# Patient Record
Sex: Female | Born: 1980 | Race: White | Hispanic: No | Marital: Married | Smoking: Never smoker
Health system: Southern US, Community
[De-identification: ages and names within clinical notes are randomized; demographics above are authoritative.]

## PROBLEM LIST (undated history)

## (undated) ENCOUNTER — Inpatient Hospital Stay (HOSPITAL_COMMUNITY): Payer: Self-pay

## (undated) DIAGNOSIS — G35D Multiple sclerosis, unspecified: Secondary | ICD-10-CM

## (undated) DIAGNOSIS — G43909 Migraine, unspecified, not intractable, without status migrainosus: Secondary | ICD-10-CM

## (undated) DIAGNOSIS — G35 Multiple sclerosis: Secondary | ICD-10-CM

## (undated) HISTORY — PX: KNEE SURGERY: SHX244

## (undated) HISTORY — PX: LAPAROSCOPY: SHX197

---

## 2000-10-25 ENCOUNTER — Emergency Department (HOSPITAL_COMMUNITY): Admission: EM | Admit: 2000-10-25 | Discharge: 2000-10-26 | Payer: Self-pay | Admitting: Emergency Medicine

## 2001-12-02 ENCOUNTER — Encounter: Admission: RE | Admit: 2001-12-02 | Discharge: 2001-12-02 | Payer: Self-pay | Admitting: Family Medicine

## 2002-01-13 ENCOUNTER — Encounter: Admission: RE | Admit: 2002-01-13 | Discharge: 2002-01-13 | Payer: Self-pay | Admitting: Family Medicine

## 2002-09-21 ENCOUNTER — Encounter: Admission: RE | Admit: 2002-09-21 | Discharge: 2002-09-21 | Payer: Self-pay | Admitting: Family Medicine

## 2002-10-26 ENCOUNTER — Encounter: Admission: RE | Admit: 2002-10-26 | Discharge: 2002-10-26 | Payer: Self-pay | Admitting: Sports Medicine

## 2004-09-19 ENCOUNTER — Emergency Department: Payer: Self-pay | Admitting: Emergency Medicine

## 2004-11-09 ENCOUNTER — Ambulatory Visit: Payer: Self-pay | Admitting: General Practice

## 2004-12-19 ENCOUNTER — Ambulatory Visit: Payer: Self-pay | Admitting: General Practice

## 2005-04-01 ENCOUNTER — Ambulatory Visit: Payer: Self-pay | Admitting: General Practice

## 2008-05-09 ENCOUNTER — Ambulatory Visit (HOSPITAL_COMMUNITY): Admission: RE | Admit: 2008-05-09 | Discharge: 2008-05-09 | Payer: Self-pay | Admitting: Obstetrics and Gynecology

## 2011-06-21 ENCOUNTER — Other Ambulatory Visit (HOSPITAL_COMMUNITY): Payer: Self-pay | Admitting: Gynecology

## 2011-06-21 DIAGNOSIS — N979 Female infertility, unspecified: Secondary | ICD-10-CM

## 2011-06-25 ENCOUNTER — Ambulatory Visit (HOSPITAL_COMMUNITY)
Admission: RE | Admit: 2011-06-25 | Discharge: 2011-06-25 | Disposition: A | Discharge status: Home / Self Care | Payer: BC Managed Care – PPO | Source: Ambulatory Visit | Attending: Gynecology | Admitting: Gynecology

## 2011-06-25 DIAGNOSIS — N979 Female infertility, unspecified: Secondary | ICD-10-CM | POA: Insufficient documentation

## 2011-06-25 MED ORDER — IOHEXOL 300 MG/ML  SOLN: 8.0000 mL | Freq: Once | INTRAMUSCULAR | Status: AC | PRN

## 2011-10-11 LAB — OB RESULTS CONSOLE GC/CHLAMYDIA
Chlamydia: NEGATIVE
Gonorrhea: NEGATIVE

## 2011-10-11 LAB — OB RESULTS CONSOLE RUBELLA ANTIBODY, IGM: Rubella: IMMUNE

## 2011-10-11 LAB — OB RESULTS CONSOLE RPR: RPR: NONREACTIVE

## 2011-10-11 LAB — OB RESULTS CONSOLE HIV ANTIBODY (ROUTINE TESTING): HIV: NONREACTIVE

## 2011-10-11 LAB — OB RESULTS CONSOLE ABO/RH

## 2011-10-11 LAB — OB RESULTS CONSOLE GBS: GBS: NEGATIVE

## 2011-11-17 ENCOUNTER — Inpatient Hospital Stay (HOSPITAL_COMMUNITY)
Admission: AD | Admit: 2011-11-17 | Discharge: 2011-11-17 | Disposition: A | Discharge status: Home / Self Care | Payer: BC Managed Care – PPO | Source: Ambulatory Visit | Attending: Obstetrics and Gynecology | Admitting: Obstetrics and Gynecology

## 2011-11-17 ENCOUNTER — Encounter (HOSPITAL_COMMUNITY): Payer: Self-pay | Admitting: *Deleted

## 2011-11-17 ENCOUNTER — Inpatient Hospital Stay (HOSPITAL_COMMUNITY): Payer: BC Managed Care – PPO

## 2011-11-17 DIAGNOSIS — O34599 Maternal care for other abnormalities of gravid uterus, unspecified trimester: Secondary | ICD-10-CM

## 2011-11-17 DIAGNOSIS — N83209 Unspecified ovarian cyst, unspecified side: Secondary | ICD-10-CM | POA: Insufficient documentation

## 2011-11-17 DIAGNOSIS — R109 Unspecified abdominal pain: Secondary | ICD-10-CM | POA: Insufficient documentation

## 2011-11-17 LAB — URINALYSIS, ROUTINE W REFLEX MICROSCOPIC
Bilirubin Urine: NEGATIVE
Protein, ur: NEGATIVE mg/dL
Urobilinogen, UA: 0.2 mg/dL (ref 0.0–1.0)
pH: 6 (ref 5.0–8.0)

## 2011-11-17 LAB — WET PREP, GENITAL: Trich, Wet Prep: NONE SEEN

## 2011-11-17 MED ORDER — IBUPROFEN 800 MG PO TABS: 800.0000 mg | ORAL_TABLET | Freq: Three times a day (TID) | ORAL | Status: AC | PRN

## 2011-11-17 NOTE — MAU Provider Note (Signed)
History     CSN: [id number]  Arrival date and time: 11/17/11 1238   None     Chief Complaint  Patient presents with  . Abdominal Pain   HPI 31 y.o. G1P0000 at [redacted]w[redacted]d c/o left lower abd pain, started this morning, worse with walking and laying supine, better when sitting up. No bleeding or discharge. H/O left ovarian cyst this pregnancy.   History reviewed. No pertinent past medical history.  Past Surgical History  Procedure Date  . Knee surgery   . Laparoscopy     History reviewed. No pertinent family history.  History  Substance Use Topics  . Smoking status: Never Smoker   . Smokeless tobacco: Not on file  . Alcohol Use: No    Allergies: Allergies not on file  Prescriptions prior to admission  Medication Sig Dispense Refill  . acetaminophen (TYLENOL) 325 MG tablet Take 650 mg by mouth every 6 (six) hours as needed. For headaches.      . ondansetron (ZOFRAN) 8 MG tablet Take 8 mg by mouth every 8 (eight) hours as needed. For nausea and vomiting.      . Prenatal Vit-Fe Fumarate-FA (PRENATAL MULTIVITAMIN) TABS Take 1 tablet by mouth daily.        Review of Systems  Constitutional: Negative.   Respiratory: Negative.   Cardiovascular: Negative.   Gastrointestinal: Positive for abdominal pain. Negative for nausea, vomiting, diarrhea and constipation.  Genitourinary: Negative for dysuria, urgency, frequency, hematuria and flank pain.       Negative for vaginal bleeding, vaginal discharge  Musculoskeletal: Negative.   Neurological: Negative.   Psychiatric/Behavioral: Negative.    Physical Exam   Blood pressure 126/73, pulse 86, temperature 99.3 F (37.4 C), temperature source Oral, resp. rate 18, height 5\' 2"  (1.575 m), weight 152 lb 6.4 oz (69.128 kg), last menstrual period 08/09/2011.  Physical Exam  Constitutional: She is oriented to person, place, and time. She appears well-developed and well-nourished. No distress.  HENT:  Head: Normocephalic and atraumatic.   Cardiovascular: Normal rate.   Respiratory: Effort normal. No respiratory distress.  GI: Soft. She exhibits no distension and no mass. There is no tenderness. There is no rebound and no guarding.  Genitourinary: There is no rash or lesion on the right labia. There is no rash or lesion on the left labia. Uterus is not deviated, not enlarged, not fixed and not tender. Cervix exhibits friability. Cervix exhibits no motion tenderness and no discharge. Right adnexum displays no mass, no tenderness and no fullness. Left adnexum displays no mass, no tenderness and no fullness. No erythema, tenderness or bleeding around the vagina. Vaginal discharge (small, white, nonmalodorous) found.    Neurological: She is alert and oriented to person, place, and time.  Skin: Skin is warm and dry.  Psychiatric: She has a normal mood and affect.    MAU Course  Procedures Results for orders placed during the hospital encounter of 11/17/11 (from the past 24 hour(s))  URINALYSIS, ROUTINE W REFLEX MICROSCOPIC     Status: Abnormal   Collection Time   11/17/11 12:45 PM      Component Value Range   Color, Urine YELLOW  YELLOW    APPearance CLEAR  CLEAR    Specific Gravity, Urine 1.025  1.005 - 1.030    pH 6.0  5.0 - 8.0    Glucose, UA NEGATIVE  NEGATIVE (mg/dL)   Hgb urine dipstick TRACE (*) NEGATIVE    Bilirubin Urine NEGATIVE  NEGATIVE    Ketones, ur  NEGATIVE  NEGATIVE (mg/dL)   Protein, ur NEGATIVE  NEGATIVE (mg/dL)   Urobilinogen, UA 0.2  0.0 - 1.0 (mg/dL)   Nitrite NEGATIVE  NEGATIVE    Leukocytes, UA NEGATIVE  NEGATIVE   URINE MICROSCOPIC-ADD ON     Status: Normal   Collection Time   11/17/11 12:45 PM      Component Value Range   Squamous Epithelial / LPF RARE  RARE    RBC / HPF 0-2  <3 (RBC/hpf)   Bacteria, UA RARE  RARE   WET PREP, GENITAL     Status: Abnormal   Collection Time   11/17/11  1:15 PM      Component Value Range   Yeast Wet Prep HPF POC NONE SEEN  NONE SEEN    Trich, Wet Prep NONE SEEN   NONE SEEN    Clue Cells Wet Prep HPF POC NONE SEEN  NONE SEEN    WBC, Wet Prep HPF POC FEW (*) NONE SEEN    U/S: cervix 4.8 cm long and closed, 3 cm left ovarian cyst, otherwise normal  Assessment and Plan  31 y.o. G1P0000 at [redacted]w[redacted]d Left ovarian cyst Motrin 800 TID prn F/U as scheduled  Greydon Betke 11/17/2011, 1:33 PM

## 2011-11-17 NOTE — MAU Note (Signed)
Pt reprots having a dull pain on her left side/abd that gets sharp when she walks or moves around. Pain started earlier today,

## 2011-11-18 LAB — GC/CHLAMYDIA PROBE AMP, GENITAL
Chlamydia, DNA Probe: NEGATIVE
GC Probe Amp, Genital: NEGATIVE

## 2012-04-19 ENCOUNTER — Inpatient Hospital Stay (HOSPITAL_COMMUNITY): Admission: AD | Admit: 2012-04-19 | Payer: Self-pay | Source: Ambulatory Visit | Admitting: Obstetrics and Gynecology

## 2012-04-28 ENCOUNTER — Telehealth (HOSPITAL_COMMUNITY): Payer: Self-pay | Admitting: *Deleted

## 2012-04-28 NOTE — Telephone Encounter (Signed)
Preadmission screen  

## 2012-05-12 ENCOUNTER — Inpatient Hospital Stay (HOSPITAL_COMMUNITY)
Admission: AD | Admit: 2012-05-12 | Discharge: 2012-05-15 | DRG: 373 | Disposition: A | Discharge status: Home / Self Care | Payer: BC Managed Care – PPO | Source: Ambulatory Visit | Attending: Obstetrics and Gynecology | Admitting: Obstetrics and Gynecology

## 2012-05-12 ENCOUNTER — Encounter (HOSPITAL_COMMUNITY): Payer: Self-pay | Admitting: Anesthesiology

## 2012-05-12 ENCOUNTER — Inpatient Hospital Stay (HOSPITAL_COMMUNITY): Payer: BC Managed Care – PPO | Admitting: Anesthesiology

## 2012-05-12 ENCOUNTER — Encounter (HOSPITAL_COMMUNITY): Payer: Self-pay

## 2012-05-12 LAB — CBC
HCT: 33.6 % — ABNORMAL LOW (ref 36.0–46.0)
Hemoglobin: 11.6 g/dL — ABNORMAL LOW (ref 12.0–15.0)
MCH: 29.1 pg (ref 26.0–34.0)
MCV: 84.4 fL (ref 78.0–100.0)
Platelets: 222 10*3/uL (ref 150–400)
RBC: 3.98 MIL/uL (ref 3.87–5.11)

## 2012-05-12 MED ORDER — OXYTOCIN 40 UNITS IN LACTATED RINGERS INFUSION - SIMPLE MED: 62.5000 mL/h | Freq: Once | INTRAVENOUS | Status: DC

## 2012-05-12 MED ORDER — ACETAMINOPHEN 325 MG PO TABS: 650.0000 mg | ORAL_TABLET | ORAL | Status: DC | PRN

## 2012-05-12 MED ORDER — ONDANSETRON HCL 4 MG/2ML IJ SOLN: 4.0000 mg | Freq: Four times a day (QID) | INTRAMUSCULAR | Status: DC | PRN

## 2012-05-12 MED ORDER — PHENYLEPHRINE 40 MCG/ML (10ML) SYRINGE FOR IV PUSH (FOR BLOOD PRESSURE SUPPORT)
80.0000 ug | PREFILLED_SYRINGE | INTRAVENOUS | Status: DC | PRN
  Filled 2012-05-12: qty 5

## 2012-05-12 MED ORDER — EPHEDRINE 5 MG/ML INJ
10.0000 mg | INTRAVENOUS | Status: DC | PRN
  Filled 2012-05-12: qty 4

## 2012-05-12 MED ORDER — CITRIC ACID-SODIUM CITRATE 334-500 MG/5ML PO SOLN: 30.0000 mL | ORAL | Status: DC | PRN

## 2012-05-12 MED ORDER — LIDOCAINE HCL (PF) 1 % IJ SOLN
INTRAMUSCULAR | Status: DC | PRN
  Administered 2012-05-12: 4 mL
  Administered 2012-05-12: 2 mL
  Administered 2012-05-12 (×2): 4 mL

## 2012-05-12 MED ORDER — LACTATED RINGERS IV SOLN: 500.0000 mL | Freq: Once | INTRAVENOUS | Status: DC

## 2012-05-12 MED ORDER — LACTATED RINGERS IV SOLN: 500.0000 mL | INTRAVENOUS | Status: DC | PRN

## 2012-05-12 MED ORDER — FENTANYL 2.5 MCG/ML BUPIVACAINE 1/10 % EPIDURAL INFUSION (WH - ANES)
14.0000 mL/h | INTRAMUSCULAR | Status: DC
  Administered 2012-05-12 – 2012-05-13 (×2): 14 mL/h via EPIDURAL
  Filled 2012-05-12 (×2): qty 125

## 2012-05-12 MED ORDER — PHENYLEPHRINE 40 MCG/ML (10ML) SYRINGE FOR IV PUSH (FOR BLOOD PRESSURE SUPPORT): 80.0000 ug | PREFILLED_SYRINGE | INTRAVENOUS | Status: DC | PRN

## 2012-05-12 MED ORDER — EPHEDRINE 5 MG/ML INJ: 10.0000 mg | INTRAVENOUS | Status: DC | PRN

## 2012-05-12 MED ORDER — LACTATED RINGERS IV SOLN
INTRAVENOUS | Status: DC
  Administered 2012-05-13: 125 mL/h via INTRAVENOUS

## 2012-05-12 MED ORDER — IBUPROFEN 600 MG PO TABS: 600.0000 mg | ORAL_TABLET | Freq: Four times a day (QID) | ORAL | Status: DC | PRN

## 2012-05-12 MED ORDER — OXYCODONE-ACETAMINOPHEN 5-325 MG PO TABS: 1.0000 | ORAL_TABLET | ORAL | Status: DC | PRN

## 2012-05-12 MED ORDER — LIDOCAINE HCL (PF) 1 % IJ SOLN
30.0000 mL | INTRAMUSCULAR | Status: DC | PRN
  Filled 2012-05-12: qty 30

## 2012-05-12 MED ORDER — DIPHENHYDRAMINE HCL 50 MG/ML IJ SOLN: 12.5000 mg | INTRAMUSCULAR | Status: DC | PRN

## 2012-05-12 MED ORDER — OXYTOCIN BOLUS FROM INFUSION
500.0000 mL | Freq: Once | INTRAVENOUS | Status: AC
  Administered 2012-05-13: 500 mL via INTRAVENOUS
  Filled 2012-05-12: qty 500

## 2012-05-12 NOTE — Anesthesia Preprocedure Evaluation (Signed)

## 2012-05-12 NOTE — Anesthesia Procedure Notes (Signed)
Epidural Patient location during procedure: OB Start time: 05/12/2012 11:25 PM  Staffing Performed by: anesthesiologist   Preanesthetic Checklist Completed: patient identified, site marked, surgical consent, pre-op evaluation, timeout performed, IV checked, risks and benefits discussed and monitors and equipment checked  Epidural Patient position: sitting Prep: site prepped and draped and DuraPrep Patient monitoring: continuous pulse ox and blood pressure Approach: midline Injection technique: LOR air  Needle:  Needle type: Tuohy  Needle gauge: 17 G Needle length: 9 cm and 9 Needle insertion depth: 5 cm cm Catheter type: closed end flexible Catheter size: 19 Gauge Catheter at skin depth: 10 cm Test dose: negative  Assessment Events: blood not aspirated, injection not painful, no injection resistance, negative IV test and no paresthesia  Additional Notes Discussed risk of headache, infection, bleeding, nerve injury and failed or incomplete block.  Patient voices understanding and wishes to proceed. Reason for block:procedure for pain

## 2012-05-12 NOTE — MAU Note (Signed)
Pt states she thinks water broke around 2015. She states she is having irregular UCs

## 2012-05-13 ENCOUNTER — Encounter (HOSPITAL_COMMUNITY): Payer: Self-pay | Admitting: *Deleted

## 2012-05-13 MED ORDER — ONDANSETRON HCL 4 MG/2ML IJ SOLN: 4.0000 mg | INTRAMUSCULAR | Status: DC | PRN

## 2012-05-13 MED ORDER — ONDANSETRON HCL 4 MG PO TABS: 4.0000 mg | ORAL_TABLET | ORAL | Status: DC | PRN

## 2012-05-13 MED ORDER — LANOLIN HYDROUS EX OINT: TOPICAL_OINTMENT | CUTANEOUS | Status: DC | PRN

## 2012-05-13 MED ORDER — TETANUS-DIPHTH-ACELL PERTUSSIS 5-2.5-18.5 LF-MCG/0.5 IM SUSP: 0.5000 mL | Freq: Once | INTRAMUSCULAR | Status: DC

## 2012-05-13 MED ORDER — WITCH HAZEL-GLYCERIN EX PADS
1.0000 "application " | MEDICATED_PAD | CUTANEOUS | Status: DC | PRN
  Administered 2012-05-14: 1 via TOPICAL

## 2012-05-13 MED ORDER — SIMETHICONE 80 MG PO CHEW: 80.0000 mg | CHEWABLE_TABLET | ORAL | Status: DC | PRN

## 2012-05-13 MED ORDER — MEDROXYPROGESTERONE ACETATE 150 MG/ML IM SUSP: 150.0000 mg | INTRAMUSCULAR | Status: DC | PRN

## 2012-05-13 MED ORDER — DIBUCAINE 1 % RE OINT: 1.0000 "application " | TOPICAL_OINTMENT | RECTAL | Status: DC | PRN

## 2012-05-13 MED ORDER — OXYCODONE-ACETAMINOPHEN 5-325 MG PO TABS: 1.0000 | ORAL_TABLET | ORAL | Status: DC | PRN

## 2012-05-13 MED ORDER — MEASLES, MUMPS & RUBELLA VAC ~~LOC~~ INJ: 0.5000 mL | INJECTION | Freq: Once | SUBCUTANEOUS | Status: DC

## 2012-05-13 MED ORDER — MEASLES, MUMPS & RUBELLA VAC ~~LOC~~ INJ
0.5000 mL | INJECTION | Freq: Once | SUBCUTANEOUS | Status: DC
  Filled 2012-05-13: qty 0.5

## 2012-05-13 MED ORDER — BENZOCAINE-MENTHOL 20-0.5 % EX AERO
1.0000 "application " | INHALATION_SPRAY | CUTANEOUS | Status: DC | PRN
  Filled 2012-05-13: qty 56

## 2012-05-13 MED ORDER — IBUPROFEN 600 MG PO TABS
600.0000 mg | ORAL_TABLET | Freq: Four times a day (QID) | ORAL | Status: DC
  Administered 2012-05-13 – 2012-05-15 (×8): 600 mg via ORAL
  Filled 2012-05-13 (×8): qty 1

## 2012-05-13 MED ORDER — IBUPROFEN 600 MG PO TABS: 600.0000 mg | ORAL_TABLET | Freq: Four times a day (QID) | ORAL | Status: DC

## 2012-05-13 MED ORDER — PRENATAL MULTIVITAMIN CH: 1.0000 | ORAL_TABLET | Freq: Every day | ORAL | Status: DC

## 2012-05-13 MED ORDER — BENZOCAINE-MENTHOL 20-0.5 % EX AERO: 1.0000 "application " | INHALATION_SPRAY | CUTANEOUS | Status: DC | PRN

## 2012-05-13 MED ORDER — PRENATAL MULTIVITAMIN CH
1.0000 | ORAL_TABLET | Freq: Every day | ORAL | Status: DC
  Administered 2012-05-14 – 2012-05-15 (×2): 1 via ORAL
  Filled 2012-05-13 (×2): qty 1

## 2012-05-13 MED ORDER — TERBUTALINE SULFATE 1 MG/ML IJ SOLN: 0.2500 mg | Freq: Once | INTRAMUSCULAR | Status: DC | PRN

## 2012-05-13 MED ORDER — WITCH HAZEL-GLYCERIN EX PADS: 1.0000 "application " | MEDICATED_PAD | CUTANEOUS | Status: DC | PRN

## 2012-05-13 MED ORDER — OXYTOCIN 40 UNITS IN LACTATED RINGERS INFUSION - SIMPLE MED
1.0000 m[IU]/min | INTRAVENOUS | Status: DC
  Administered 2012-05-13: 1 m[IU]/min via INTRAVENOUS
  Filled 2012-05-13: qty 1000

## 2012-05-13 NOTE — H&P (Signed)
31 yo G1P0 @ 39+5 wks admitted w/ SROM, clear fluid.  Rare ctx, now augmented w/ pitocin.  Pregnancy uncomplicated  Past History - negative PSHx:  Laparoscopy, knee surgery All:  Compazine Meds:  PNV  AF, VSS + FHT Toco Q2-3 Gen - comfortable w/ ctx Abd - gravid, NT  EFW 8# Ext - NT Cvx 6/90/-1 IUPC placed  A/P:  SROM Continue pitocin augmentation

## 2012-05-13 NOTE — Progress Notes (Signed)
SVD of vigerous female infant w/ apgars of 8,9.  Placenta delivered spontaneous w/ 3VC.   1st degree lac repaired w/ 3-0 vicryl rapide.  Fundus firm.  EBL 300cc  Mom & baby stable in LDR

## 2012-05-13 NOTE — Anesthesia Postprocedure Evaluation (Signed)
  Anesthesia Post-op Note  Patient: Casey Murray  Procedure(s) Performed: * No procedures listed *  Patient Location: Mother/Baby  Anesthesia Type: Epidural  Level of Consciousness: awake  Airway and Oxygen Therapy: Patient Spontanous Breathing  Post-op Pain: mild  Post-op Assessment: Patient's Cardiovascular Status Stable and Respiratory Function Stable  Post-op Vital Signs: stable  Complications: No apparent anesthesia complications

## 2012-05-13 NOTE — Progress Notes (Signed)
Called Dr Arelia Sneddon, informed of sve, woul dhe like to start pictoin, to recheck in a couple hours, and call him back.

## 2012-05-14 LAB — CBC
Hemoglobin: 10 g/dL — ABNORMAL LOW (ref 12.0–15.0)
MCHC: 34.4 g/dL (ref 30.0–36.0)
RBC: 3.42 MIL/uL — ABNORMAL LOW (ref 3.87–5.11)

## 2012-05-14 MED ORDER — INFLUENZA VIRUS VACC SPLIT PF IM SUSP
0.5000 mL | Freq: Once | INTRAMUSCULAR | Status: AC
  Administered 2012-05-14: 0.5 mL via INTRAMUSCULAR
  Filled 2012-05-14: qty 0.5

## 2012-05-14 MED ORDER — DOCUSATE SODIUM 100 MG PO CAPS
100.0000 mg | ORAL_CAPSULE | Freq: Every day | ORAL | Status: DC
  Administered 2012-05-14 – 2012-05-15 (×2): 100 mg via ORAL
  Filled 2012-05-14 (×3): qty 1

## 2012-05-14 NOTE — Progress Notes (Signed)
[name] Subjective: no complaints, up ad lib, voiding, tolerating PO and + flatus  Objective: Blood pressure 93/66, pulse 68, temperature 98.3 F (36.8 C), temperature source Oral, resp. rate 18, height 5\' 2"  (1.575 m), weight 79.379 kg (175 lb), last menstrual period 08/09/2011, SpO2 98.00%, unknown if currently breastfeeding.  Physical Exam:  General: alert and cooperative Lochia: appropriate Uterine Fundus: firm Incision: perineum intact, small labial edema DVT Evaluation: No evidence of DVT seen on physical exam. No significant calf/ankle edema.   Basename 05/14/12 0500 05/12/12 2245  HGB 10.0* 11.6*  HCT 29.1* 33.6*    Assessment/Plan: Plan for discharge tomorrow   LOS: 2 days   Casey Murray G 05/14/2012, 8:36 AM

## 2012-05-15 MED ORDER — IBUPROFEN 600 MG PO TABS: 600.0000 mg | ORAL_TABLET | Freq: Four times a day (QID) | ORAL | Status: DC

## 2012-05-15 NOTE — Discharge Summary (Signed)
Obstetric Discharge Summary Reason for Admission: rupture of membranes Prenatal Procedures: ultrasound Intrapartum Procedures: spontaneous vaginal delivery Postpartum Procedures: none Complications-Operative and Postpartum: 1 degree perineal laceration Hemoglobin  Date Value Range Status  05/14/2012 10.0* 12.0 - 15.0 g/dL Final     HCT  Date Value Range Status  05/14/2012 29.1* 36.0 - 46.0 % Final    Physical Exam:  General: alert and cooperative Lochia: appropriate Uterine Fundus: firm Incision:perineum intact DVT Evaluation: No evidence of DVT seen on physical exam. Negative Homan's sign. No significant calf/ankle edema.  Discharge Diagnoses: Term Pregnancy-delivered  Discharge Information: Date: 05/15/2012 Activity: pelvic rest Diet: routine Medications: PNV and Ibuprofen Condition: stable Instructions: refer to practice specific booklet Discharge to: home   Newborn Data: Live born female  Birth Weight: 8 lb 5 oz (3770 g) APGAR: 8, 9  Home with mother.  CURTIS,CAROL G 05/15/2012, 9:06 AM

## 2012-05-27 ENCOUNTER — Ambulatory Visit (HOSPITAL_COMMUNITY)
Admission: RE | Admit: 2012-05-27 | Discharge: 2012-05-27 | Disposition: A | Discharge status: Home / Self Care | Payer: BC Managed Care – PPO | Source: Ambulatory Visit | Attending: Obstetrics and Gynecology | Admitting: Obstetrics and Gynecology

## 2012-05-27 NOTE — Progress Notes (Signed)
Adult Lactation Consultation Outpatient Visit Note                                                                      DOB [birth year] Patient Name: Casey Murray                  BW 8-5 Date of Birth: 04/05/1981 Gestational Age at Delivery: Unknown Type of Delivery: vaginal del  Breastfeeding History: Frequency of Breastfeeding:  Length of Feeding:  Voids: 10-12 Stools: 3 mustard seedy   Supplementing / Method: Pumping:  Type of Pump:   Frequency:  Volume:    Comments: Infant has been bottle feeding taking 3 ounces every 2-3 hours for the last week due to mothers sore nipples. She has attempt to use nipple shield several times without success. Mother wants to exclusively breastfeed.  Mothers nipples are much better. She still has slight pink coloring to shaft but denies pain, itching or burning.  Consultation Evaluation:  SNS was sat up with 60 ml of EBM. Infant placed at breast with #24 nipple shield. Infant latched with slight adjustment to lower jaw. After several mins . Nipple shield was removed and infant fed on bare breast. Initial Feeding Assessment: Pre-feed [id number] Post-feed [id number]:1478 Amount Transferred:52ml Comments  Additional Feeding Assessment: Pre-feed [id number] Post-feed [id number] Amount Transferred:21ml,  Comments: infant placed in football hold and sustained latch for 15 mins  Additional Feeding Assessment: Pre-feed Weight: Post-feed Weight: Amount Transferred: Comments:  Total Breast milk Transferred this Visit: 58ml, 23 ml from mother and 25 from SNS Total Supplement Given:   Additional Interventions: Mother encouraged to offer breast on cue and use sns to give at least 45-60 with each feeding as needed. Mother inst to use nipple shield if unable to latch to bare breast. inst to continue to pump to protect milk supply . Lots of teaching done and inst on proper latch on.   Follow-Up October 29 at 2:30     Stevan Born  [facility] 05/27/2012, 4:03 PM

## 2012-06-02 ENCOUNTER — Ambulatory Visit (HOSPITAL_COMMUNITY): Payer: BC Managed Care – PPO

## 2013-06-10 ENCOUNTER — Other Ambulatory Visit: Payer: Self-pay

## 2014-06-06 ENCOUNTER — Encounter (HOSPITAL_COMMUNITY): Payer: Self-pay | Admitting: *Deleted

## 2014-08-12 ENCOUNTER — Other Ambulatory Visit: Payer: Self-pay | Admitting: Obstetrics and Gynecology

## 2014-08-15 LAB — CYTOLOGY - PAP

## 2019-04-01 ENCOUNTER — Other Ambulatory Visit: Payer: Self-pay

## 2019-04-01 DIAGNOSIS — Z20822 Contact with and (suspected) exposure to covid-19: Secondary | ICD-10-CM

## 2019-04-03 LAB — NOVEL CORONAVIRUS, NAA: SARS-CoV-2, NAA: NOT DETECTED

## 2019-07-27 ENCOUNTER — Ambulatory Visit: Payer: BC Managed Care – PPO | Attending: Internal Medicine

## 2019-07-27 DIAGNOSIS — Z20822 Contact with and (suspected) exposure to covid-19: Secondary | ICD-10-CM

## 2019-07-28 LAB — NOVEL CORONAVIRUS, NAA: SARS-CoV-2, NAA: NOT DETECTED

## 2020-04-05 ENCOUNTER — Emergency Department (HOSPITAL_COMMUNITY): Payer: BC Managed Care – PPO | Admitting: Anesthesiology

## 2020-04-05 ENCOUNTER — Other Ambulatory Visit: Payer: Self-pay

## 2020-04-05 ENCOUNTER — Emergency Department (HOSPITAL_COMMUNITY): Payer: BC Managed Care – PPO

## 2020-04-05 ENCOUNTER — Ambulatory Visit (HOSPITAL_COMMUNITY)
Admission: EM | Admit: 2020-04-05 | Discharge: 2020-04-05 | Disposition: A | Discharge status: Home / Self Care | Payer: BC Managed Care – PPO | Attending: General Surgery | Admitting: General Surgery

## 2020-04-05 ENCOUNTER — Encounter (HOSPITAL_COMMUNITY)
Admission: EM | Disposition: A | Discharge status: Home / Self Care | Payer: Self-pay | Source: Home / Self Care | Attending: Emergency Medicine

## 2020-04-05 ENCOUNTER — Encounter (HOSPITAL_COMMUNITY): Payer: Self-pay | Admitting: Emergency Medicine

## 2020-04-05 DIAGNOSIS — Z20822 Contact with and (suspected) exposure to covid-19: Secondary | ICD-10-CM | POA: Diagnosis not present

## 2020-04-05 DIAGNOSIS — K353 Acute appendicitis with localized peritonitis, without perforation or gangrene: Secondary | ICD-10-CM | POA: Diagnosis present

## 2020-04-05 DIAGNOSIS — Z888 Allergy status to other drugs, medicaments and biological substances status: Secondary | ICD-10-CM | POA: Diagnosis not present

## 2020-04-05 DIAGNOSIS — Z8249 Family history of ischemic heart disease and other diseases of the circulatory system: Secondary | ICD-10-CM | POA: Diagnosis not present

## 2020-04-05 DIAGNOSIS — Z833 Family history of diabetes mellitus: Secondary | ICD-10-CM | POA: Diagnosis not present

## 2020-04-05 HISTORY — PX: LAPAROSCOPIC APPENDECTOMY: SHX408

## 2020-04-05 LAB — I-STAT BETA HCG BLOOD, ED (MC, WL, AP ONLY): I-stat hCG, quantitative: <5 m[IU]/mL (ref <5)

## 2020-04-05 LAB — URINALYSIS, ROUTINE W REFLEX MICROSCOPIC
Bilirubin Urine: NEGATIVE
Glucose, UA: NEGATIVE mg/dL
Ketones, ur: NEGATIVE mg/dL
Nitrite: NEGATIVE
Protein, ur: NEGATIVE mg/dL
Specific Gravity, Urine: 1.008 (ref 1.005–1.030)
pH: 7 (ref 5.0–8.0)

## 2020-04-05 LAB — CBC
HCT: 42 % (ref 36.0–46.0)
Hemoglobin: 14.9 g/dL (ref 12.0–15.0)
MCH: 30.2 pg (ref 26.0–34.0)
MCHC: 35.5 g/dL (ref 30.0–36.0)
MCV: 85 fL (ref 80.0–100.0)
Platelets: 317 10*3/uL (ref 150–400)
RBC: 4.94 MIL/uL (ref 3.87–5.11)
RDW: 13 % (ref 11.5–15.5)
WBC: 18.7 10*3/uL — ABNORMAL HIGH (ref 4.0–10.5)
nRBC: 0 % (ref 0.0–0.2)

## 2020-04-05 LAB — COMPREHENSIVE METABOLIC PANEL
ALT: 17 U/L (ref 0–44)
AST: 16 U/L (ref 15–41)
Albumin: 5.4 g/dL — ABNORMAL HIGH (ref 3.5–5.0)
Alkaline Phosphatase: 60 U/L (ref 38–126)
Anion gap: 13 (ref 5–15)
BUN: 11 mg/dL (ref 6–20)
CO2: 19 mmol/L — ABNORMAL LOW (ref 22–32)
Calcium: 10.3 mg/dL (ref 8.9–10.3)
Chloride: 106 mmol/L (ref 98–111)
Creatinine, Ser: 0.78 mg/dL (ref 0.44–1.00)
GFR calc Af Amer: >60 mL/min (ref >60)
GFR calc non Af Amer: >60 mL/min (ref >60)
Glucose, Bld: 126 mg/dL — ABNORMAL HIGH (ref 70–99)
Potassium: 3.6 mmol/L (ref 3.5–5.1)
Sodium: 138 mmol/L (ref 135–145)
Total Bilirubin: 1.5 mg/dL — ABNORMAL HIGH (ref 0.3–1.2)
Total Protein: 8.3 g/dL — ABNORMAL HIGH (ref 6.5–8.1)

## 2020-04-05 LAB — SARS CORONAVIRUS 2 BY RT PCR (HOSPITAL ORDER, PERFORMED IN ~~LOC~~ HOSPITAL LAB): SARS Coronavirus 2: NEGATIVE

## 2020-04-05 LAB — LIPASE, BLOOD: Lipase: 21 U/L (ref 11–51)

## 2020-04-05 SURGERY — APPENDECTOMY, LAPAROSCOPIC
Anesthesia: General

## 2020-04-05 MED ORDER — KETOROLAC TROMETHAMINE 30 MG/ML IJ SOLN
INTRAMUSCULAR | Status: DC | PRN
  Administered 2020-04-05: 30 mg via INTRAVENOUS

## 2020-04-05 MED ORDER — DEXAMETHASONE SODIUM PHOSPHATE 10 MG/ML IJ SOLN
INTRAMUSCULAR | Status: AC
  Filled 2020-04-05: qty 1

## 2020-04-05 MED ORDER — MIDAZOLAM HCL 2 MG/2ML IJ SOLN
INTRAMUSCULAR | Status: DC | PRN
  Administered 2020-04-05: 2 mg via INTRAVENOUS

## 2020-04-05 MED ORDER — FENTANYL CITRATE (PF) 250 MCG/5ML IJ SOLN
INTRAMUSCULAR | Status: DC | PRN
  Administered 2020-04-05: 100 ug via INTRAVENOUS
  Administered 2020-04-05: 50 ug via INTRAVENOUS

## 2020-04-05 MED ORDER — PROPOFOL 10 MG/ML IV BOLUS
INTRAVENOUS | Status: DC | PRN
  Administered 2020-04-05: 150 mg via INTRAVENOUS

## 2020-04-05 MED ORDER — TRAMADOL HCL 50 MG PO TABS: 50.0000 mg | ORAL_TABLET | Freq: Four times a day (QID) | ORAL | 0 refills | Status: DC | PRN

## 2020-04-05 MED ORDER — SODIUM CHLORIDE (PF) 0.9 % IJ SOLN
INTRAMUSCULAR | Status: AC
  Filled 2020-04-05: qty 50

## 2020-04-05 MED ORDER — LACTATED RINGERS IR SOLN
Status: DC | PRN
  Administered 2020-04-05: 1000 mL

## 2020-04-05 MED ORDER — OXYCODONE HCL 5 MG PO TABS: 5.0000 mg | ORAL_TABLET | Freq: Once | ORAL | Status: DC | PRN

## 2020-04-05 MED ORDER — ROCURONIUM BROMIDE 10 MG/ML (PF) SYRINGE
PREFILLED_SYRINGE | INTRAVENOUS | Status: AC
  Filled 2020-04-05: qty 10

## 2020-04-05 MED ORDER — IBUPROFEN 800 MG PO TABS: 800.0000 mg | ORAL_TABLET | Freq: Three times a day (TID) | ORAL | 0 refills | Status: DC | PRN

## 2020-04-05 MED ORDER — ESMOLOL HCL 100 MG/10ML IV SOLN
INTRAVENOUS | Status: AC
  Filled 2020-04-05: qty 10

## 2020-04-05 MED ORDER — LACTATED RINGERS IV SOLN: INTRAVENOUS | Status: DC

## 2020-04-05 MED ORDER — DEXAMETHASONE SODIUM PHOSPHATE 10 MG/ML IJ SOLN
INTRAMUSCULAR | Status: DC | PRN
  Administered 2020-04-05: 10 mg via INTRAVENOUS

## 2020-04-05 MED ORDER — METRONIDAZOLE IN NACL 5-0.79 MG/ML-% IV SOLN
500.0000 mg | Freq: Once | INTRAVENOUS | Status: AC
  Administered 2020-04-05: 500 mg via INTRAVENOUS
  Filled 2020-04-05: qty 100

## 2020-04-05 MED ORDER — ROCURONIUM BROMIDE 10 MG/ML (PF) SYRINGE
PREFILLED_SYRINGE | INTRAVENOUS | Status: DC | PRN
  Administered 2020-04-05: 30 mg via INTRAVENOUS

## 2020-04-05 MED ORDER — FENTANYL CITRATE (PF) 100 MCG/2ML IJ SOLN: 25.0000 ug | INTRAMUSCULAR | Status: DC | PRN

## 2020-04-05 MED ORDER — SUGAMMADEX SODIUM 200 MG/2ML IV SOLN
INTRAVENOUS | Status: DC | PRN
  Administered 2020-04-05: 160 mg via INTRAVENOUS

## 2020-04-05 MED ORDER — SCOPOLAMINE 1 MG/3DAYS TD PT72
MEDICATED_PATCH | TRANSDERMAL | Status: AC
  Administered 2020-04-05: 1.5 mg via TRANSDERMAL
  Filled 2020-04-05: qty 1

## 2020-04-05 MED ORDER — LIDOCAINE 2% (20 MG/ML) 5 ML SYRINGE
INTRAMUSCULAR | Status: AC
  Filled 2020-04-05: qty 5

## 2020-04-05 MED ORDER — SCOPOLAMINE 1 MG/3DAYS TD PT72: 1.0000 | MEDICATED_PATCH | TRANSDERMAL | Status: DC

## 2020-04-05 MED ORDER — GABAPENTIN 300 MG PO CAPS
ORAL_CAPSULE | ORAL | Status: AC
  Administered 2020-04-05: 300 mg via ORAL
  Filled 2020-04-05: qty 1

## 2020-04-05 MED ORDER — KETOROLAC TROMETHAMINE 15 MG/ML IJ SOLN: 15.0000 mg | INTRAMUSCULAR | Status: DC

## 2020-04-05 MED ORDER — MORPHINE SULFATE (PF) 4 MG/ML IV SOLN
4.0000 mg | Freq: Once | INTRAVENOUS | Status: AC
  Administered 2020-04-05: 4 mg via INTRAVENOUS
  Filled 2020-04-05: qty 1

## 2020-04-05 MED ORDER — BUPIVACAINE-EPINEPHRINE 0.25% -1:200000 IJ SOLN
INTRAMUSCULAR | Status: DC | PRN
  Administered 2020-04-05: 30 mL

## 2020-04-05 MED ORDER — IOHEXOL 300 MG/ML  SOLN
100.0000 mL | Freq: Once | INTRAMUSCULAR | Status: AC | PRN
  Administered 2020-04-05: 100 mL via INTRAVENOUS

## 2020-04-05 MED ORDER — MIDAZOLAM HCL 2 MG/2ML IJ SOLN
INTRAMUSCULAR | Status: AC
  Filled 2020-04-05: qty 2

## 2020-04-05 MED ORDER — OXYCODONE HCL 5 MG/5ML PO SOLN: 5.0000 mg | Freq: Once | ORAL | Status: DC | PRN

## 2020-04-05 MED ORDER — TRAMADOL HCL 50 MG PO TABS: 50.0000 mg | ORAL_TABLET | Freq: Four times a day (QID) | ORAL | 0 refills | Status: AC | PRN

## 2020-04-05 MED ORDER — BUPIVACAINE-EPINEPHRINE (PF) 0.25% -1:200000 IJ SOLN
INTRAMUSCULAR | Status: AC
  Filled 2020-04-05: qty 30

## 2020-04-05 MED ORDER — ESMOLOL HCL 100 MG/10ML IV SOLN
INTRAVENOUS | Status: DC | PRN
  Administered 2020-04-05: 30 mg via INTRAVENOUS

## 2020-04-05 MED ORDER — CEFTRIAXONE SODIUM 1 G IJ SOLR
1.0000 g | Freq: Once | INTRAMUSCULAR | Status: AC
  Administered 2020-04-05: 1 g via INTRAVENOUS
  Filled 2020-04-05: qty 10

## 2020-04-05 MED ORDER — FENTANYL CITRATE (PF) 250 MCG/5ML IJ SOLN
INTRAMUSCULAR | Status: AC
Start: 2020-04-05 — End: ?
  Filled 2020-04-05: qty 5

## 2020-04-05 MED ORDER — ONDANSETRON HCL 4 MG/2ML IJ SOLN
INTRAMUSCULAR | Status: AC
  Filled 2020-04-05: qty 2

## 2020-04-05 MED ORDER — PROPOFOL 10 MG/ML IV BOLUS
INTRAVENOUS | Status: AC
  Filled 2020-04-05: qty 20

## 2020-04-05 MED ORDER — ACETAMINOPHEN 500 MG PO TABS
1000.0000 mg | ORAL_TABLET | ORAL | Status: AC
  Administered 2020-04-05: 1000 mg via ORAL

## 2020-04-05 MED ORDER — SODIUM CHLORIDE 0.9 % IV BOLUS
1000.0000 mL | Freq: Once | INTRAVENOUS | Status: AC
Start: 2020-04-05 — End: 2020-04-05
  Administered 2020-04-05: 1000 mL via INTRAVENOUS

## 2020-04-05 MED ORDER — SUCCINYLCHOLINE CHLORIDE 200 MG/10ML IV SOSY
PREFILLED_SYRINGE | INTRAVENOUS | Status: DC | PRN
  Administered 2020-04-05: 100 mg via INTRAVENOUS

## 2020-04-05 MED ORDER — LIDOCAINE 2% (20 MG/ML) 5 ML SYRINGE
INTRAMUSCULAR | Status: DC | PRN
  Administered 2020-04-05: 100 mg via INTRAVENOUS

## 2020-04-05 MED ORDER — ONDANSETRON HCL 4 MG/2ML IJ SOLN
INTRAMUSCULAR | Status: DC | PRN
  Administered 2020-04-05: 4 mg via INTRAVENOUS

## 2020-04-05 MED ORDER — KETOROLAC TROMETHAMINE 30 MG/ML IJ SOLN: 30.0000 mg | Freq: Once | INTRAMUSCULAR | Status: DC | PRN

## 2020-04-05 MED ORDER — 0.9 % SODIUM CHLORIDE (POUR BTL) OPTIME
TOPICAL | Status: DC | PRN
  Administered 2020-04-05: 1000 mL

## 2020-04-05 MED ORDER — GABAPENTIN 300 MG PO CAPS: 300.0000 mg | ORAL_CAPSULE | ORAL | Status: AC

## 2020-04-05 MED ORDER — ACETAMINOPHEN 500 MG PO TABS
ORAL_TABLET | ORAL | Status: AC
  Filled 2020-04-05: qty 2

## 2020-04-05 MED ORDER — ONDANSETRON HCL 4 MG/2ML IJ SOLN
4.0000 mg | Freq: Once | INTRAMUSCULAR | Status: AC
  Administered 2020-04-05: 4 mg via INTRAVENOUS
  Filled 2020-04-05: qty 2

## 2020-04-05 SURGICAL SUPPLY — 49 items
ADH SKN CLS APL DERMABOND .7 (GAUZE/BANDAGES/DRESSINGS) ×1
APL SKNCLS STERI-STRIP NONHPOA (GAUZE/BANDAGES/DRESSINGS) ×2
APPLIER CLIP 5 13 M/L LIGAMAX5 (MISCELLANEOUS)
APPLIER CLIP ROT 10 11.4 M/L (STAPLE)
APR CLP MED LRG 11.4X10 (STAPLE)
APR CLP MED LRG 5 ANG JAW (MISCELLANEOUS)
BAG SPEC RTRVL 10 TROC 200 (ENDOMECHANICALS) ×1
BENZOIN TINCTURE PRP APPL 2/3 (GAUZE/BANDAGES/DRESSINGS) ×4 IMPLANT
BNDG ADH 1X3 SHEER STRL LF (GAUZE/BANDAGES/DRESSINGS) ×10 IMPLANT
BNDG ADH THN 3X1 STRL LF (GAUZE/BANDAGES/DRESSINGS) ×5
CABLE HIGH FREQUENCY MONO STRZ (ELECTRODE) ×2 IMPLANT
CLIP APPLIE 5 13 M/L LIGAMAX5 (MISCELLANEOUS) IMPLANT
CLIP APPLIE ROT 10 11.4 M/L (STAPLE) IMPLANT
CLIP VESOLOCK XL 6/CT (CLIP) ×2 IMPLANT
COVER SURGICAL LIGHT HANDLE (MISCELLANEOUS) ×2 IMPLANT
COVER WAND RF STERILE (DRAPES) IMPLANT
DECANTER SPIKE VIAL GLASS SM (MISCELLANEOUS) ×2 IMPLANT
DERMABOND ADVANCED (GAUZE/BANDAGES/DRESSINGS) ×1
DERMABOND ADVANCED .7 DNX12 (GAUZE/BANDAGES/DRESSINGS) IMPLANT
DRAIN CHANNEL 19F RND (DRAIN) IMPLANT
DRAPE LAPAROSCOPIC ABDOMINAL (DRAPES) ×2 IMPLANT
ELECT REM PT RETURN 15FT ADLT (MISCELLANEOUS) ×2 IMPLANT
ENDOLOOP SUT PDS II  0 18 (SUTURE)
ENDOLOOP SUT PDS II 0 18 (SUTURE) IMPLANT
EVACUATOR SILICONE 100CC (DRAIN) IMPLANT
GLOVE BIOGEL PI IND STRL 7.0 (GLOVE) ×1 IMPLANT
GLOVE BIOGEL PI INDICATOR 7.0 (GLOVE) ×1
GLOVE SURG SS PI 7.0 STRL IVOR (GLOVE) ×2 IMPLANT
GOWN STRL REUS W/TWL LRG LVL3 (GOWN DISPOSABLE) ×2 IMPLANT
GOWN STRL REUS W/TWL XL LVL3 (GOWN DISPOSABLE) ×2 IMPLANT
GRASPER SUT TROCAR 14GX15 (MISCELLANEOUS) ×1 IMPLANT
KIT BASIN OR (CUSTOM PROCEDURE TRAY) ×2 IMPLANT
KIT TURNOVER KIT A (KITS) ×1 IMPLANT
PENCIL SMOKE EVACUATOR (MISCELLANEOUS) IMPLANT
POUCH RETRIEVAL ECOSAC 10 (ENDOMECHANICALS) ×1 IMPLANT
POUCH RETRIEVAL ECOSAC 10MM (ENDOMECHANICALS) ×2
SCISSORS LAP 5X35 DISP (ENDOMECHANICALS) ×2 IMPLANT
SET IRRIG TUBING LAPAROSCOPIC (IRRIGATION / IRRIGATOR) ×2 IMPLANT
SET TUBE SMOKE EVAC HIGH FLOW (TUBING) ×2 IMPLANT
SLEEVE XCEL OPT CAN 5 100 (ENDOMECHANICALS) ×2 IMPLANT
STRIP CLOSURE SKIN 1/2X4 (GAUZE/BANDAGES/DRESSINGS) ×2 IMPLANT
STRIP CLOSURE SKIN 1/4X4 (GAUZE/BANDAGES/DRESSINGS) ×2 IMPLANT
SUT ETHILON 2 0 PS N (SUTURE) IMPLANT
SUT MNCRL AB 4-0 PS2 18 (SUTURE) ×2 IMPLANT
TOWEL OR 17X26 10 PK STRL BLUE (TOWEL DISPOSABLE) ×2 IMPLANT
TOWEL OR NON WOVEN STRL DISP B (DISPOSABLE) ×2 IMPLANT
TRAY LAPAROSCOPIC (CUSTOM PROCEDURE TRAY) ×2 IMPLANT
TROCAR BLADELESS OPT 5 100 (ENDOMECHANICALS) ×2 IMPLANT
TROCAR XCEL 12X100 BLDLESS (ENDOMECHANICALS) ×2 IMPLANT

## 2020-04-05 NOTE — Anesthesia Preprocedure Evaluation (Signed)
Anesthesia Evaluation  Patient identified by MRN, date of birth, ID band Patient awake    Reviewed: Allergy & Precautions, NPO status , Patient's Chart, lab work & pertinent test results  Airway Mallampati: II  TM Distance: >3 FB Neck ROM: Full    Dental no notable dental hx.    Pulmonary neg pulmonary ROS,    Pulmonary exam normal breath sounds clear to auscultation       Cardiovascular negative cardio ROS Normal cardiovascular exam Rhythm:Regular Rate:Normal     Neuro/Psych negative neurological ROS  negative psych ROS   GI/Hepatic negative GI ROS, Neg liver ROS,   Endo/Other  negative endocrine ROS  Renal/GU negative Renal ROS  negative genitourinary   Musculoskeletal negative musculoskeletal ROS (+)   Abdominal   Peds negative pediatric ROS (+)  Hematology negative hematology ROS (+)   Anesthesia Other Findings   Reproductive/Obstetrics negative OB ROS                             Anesthesia Physical Anesthesia Plan  ASA: I  Anesthesia Plan: General   Post-op Pain Management:    Induction: Intravenous and Rapid sequence  PONV Risk Score and Plan: 3 and Ondansetron, Dexamethasone and Treatment may vary due to age or medical condition  Airway Management Planned: Oral ETT  Additional Equipment:   Intra-op Plan:   Post-operative Plan: Extubation in OR  Informed Consent: I have reviewed the patients History and Physical, chart, labs and discussed the procedure including the risks, benefits and alternatives for the proposed anesthesia with the patient or authorized representative who has indicated his/her understanding and acceptance.     Dental advisory given  Plan Discussed with: CRNA and Surgeon  Anesthesia Plan Comments:         Anesthesia Quick Evaluation

## 2020-04-05 NOTE — Discharge Instructions (Signed)
[facility] LAPAROSCOPIC SURGERY: POST OP INSTRUCTIONS Always review your discharge instruction sheet given to you by the facility where your surgery was performed. IF YOU HAVE DISABILITY OR FAMILY LEAVE FORMS, YOU MUST BRING THEM TO THE OFFICE FOR PROCESSING.   DO NOT GIVE THEM TO YOUR DOCTOR.  PAIN CONTROL  1. First take acetaminophen (Tylenol) AND/or ibuprofen (Advil) to control your pain after surgery.  Follow directions on package.  Taking acetaminophen (Tylenol) and/or ibuprofen (Advil) regularly after surgery will help to control your pain and lower the amount of prescription pain medication you may need.  You should not take more than 3,000 mg (3 grams) of acetaminophen (Tylenol) in 24 hours.  You should not take ibuprofen (Advil), aleve, motrin, naprosyn or other NSAIDS if you have a history of stomach ulcers or chronic kidney disease.  2. A prescription for pain medication may be given to you upon discharge.  Take your pain medication as prescribed, if you still have uncontrolled pain after taking acetaminophen (Tylenol) or ibuprofen (Advil). 3. Use ice packs to help control pain. 4. If you need a refill on your pain medication, please contact your pharmacy.  They will contact our office to request authorization. Prescriptions will not be filled after 5pm or on week-ends.  HOME MEDICATIONS 5. Take your usually prescribed medications unless otherwise directed.  DIET 6. You should follow a light diet the first few days after arrival home.  Be sure to include lots of fluids daily. Avoid fatty, fried foods.   CONSTIPATION 7. It is common to experience some constipation after surgery and if you are taking pain medication.  Increasing fluid intake and taking a stool softener (such as Colace) will usually help or prevent this problem from occurring.  A mild laxative (Milk of Magnesia or Miralax) should be taken according to package instructions if there are no bowel  movements after 48 hours.  WOUND/INCISION CARE 8. Most patients will experience some swelling and bruising in the area of the incisions.  Ice packs will help.  Swelling and bruising can take several days to resolve.  9. Unless discharge instructions indicate otherwise, follow guidelines below  a. STERI-STRIPS - you may remove your outer bandages 48 hours after surgery, and you may shower at that time.  You have steri-strips (small skin tapes) in place directly over the incision.  These strips should be left on the skin for 7-10 days.   b. DERMABOND/SKIN GLUE - you may shower in 24 hours.  The glue will flake off over the next 2-3 weeks. 10. Any sutures or staples will be removed at the office during your follow-up visit.  ACTIVITIES 11. You may resume regular (light) daily activities beginning the next day--such as daily self-care, walking, climbing stairs--gradually increasing activities as tolerated.  You may have sexual intercourse when it is comfortable.  Refrain from any heavy lifting or straining until approved by your doctor. a. You may drive when you are no longer taking prescription pain medication, you can comfortably wear a seatbelt, and you can safely maneuver your car and apply brakes.  FOLLOW-UP 12. You should see your doctor in the office for a follow-up appointment approximately 2-3 weeks after your surgery.  You should have been given your post-op/follow-up appointment when your surgery was scheduled.  If you did not receive a post-op/follow-up appointment, make sure that you call for this appointment within a day or two after you arrive home to insure a convenient appointment time.     WHEN TO CALL YOUR DOCTOR: 1. Fever over 101.0 2. Inability to urinate 3. Continued bleeding from incision. 4. Increased pain, redness, or drainage from the incision. 5. Increasing abdominal pain  The clinic staff is available to answer your questions during regular business hours.  Please don't  hesitate to call and ask to speak to one of the nurses for clinical concerns.  If you have a medical emergency, go to the nearest emergency room or call 911.  A surgeon from [facility] is always on call at the hospital. [address], [address], [place], [place]  [address] ? [address] [address], [place], [place]   [address] [phone] ? [phone] ? FAX [phone] Web site: [email or web address]  .........   Managing Your Pain After Surgery Without Opioids    Thank you for participating in our program to help patients manage their pain after surgery without opioids. This is part of our effort to provide you with the best care possible, without exposing you or your family to the risk that opioids pose.  What pain can I expect after surgery? You can expect to have some pain after surgery. This is normal. The pain is typically worse the day after surgery, and quickly begins to get better. Many studies have found that many patients are able to manage their pain after surgery with Over-the-Counter (OTC) medications such as Tylenol and Motrin. If you have a condition that does not allow you to take Tylenol or Motrin, notify your surgical team.  How will I manage my pain? The best strategy for controlling your pain after surgery is around the clock pain control with Tylenol (acetaminophen) and Motrin (ibuprofen or Advil). Alternating these medications with each other allows you to maximize your pain control. In addition to Tylenol and Motrin, you can use heating pads or ice packs on your incisions to help reduce your pain.  How will I alternate your regular strength over-the-counter pain medication? You will take a dose of pain medication every three hours. ; Start by taking 650 mg of Tylenol (2 pills of 325 mg) ; 3 hours later take 600 mg of Motrin (3 pills of 200 mg) ; 3 hours after taking the Motrin take 650 mg of Tylenol ; 3 hours after that take 600 mg of  Motrin.   - 1 -  See example - if your first dose of Tylenol is at 12:00 PM   12:00 PM Tylenol 650 mg (2 pills of 325 mg)  3:00 PM Motrin 600 mg (3 pills of 200 mg)  6:00 PM Tylenol 650 mg (2 pills of 325 mg)  9:00 PM Motrin 600 mg (3 pills of 200 mg)  Continue alternating every 3 hours   We recommend that you follow this schedule around-the-clock for at least 3 days after surgery, or until you feel that it is no longer needed. Use the table on the last page of this handout to keep track of the medications you are taking. Important: Do not take more than 3000mg of Tylenol or 3200mg of Motrin in a 24-hour period. Do not take ibuprofen/Motrin if you have a history of bleeding stomach ulcers, severe kidney disease, &/or actively taking a blood thinner  What if I still have pain? If you have pain that is not controlled with the over-the-counter pain medications (Tylenol and Motrin or Advil) you might have what we call "breakthrough" pain. You will receive a prescription for a small amount of an opioid pain medication such as   Oxycodone, Tramadol, or Tylenol with Codeine. Use these opioid pills in the first 24 hours after surgery if you have breakthrough pain. Do not take more than 1 pill every 4-6 hours.  If you still have uncontrolled pain after using all opioid pills, don't hesitate to call our staff using the number provided. We will help make sure you are managing your pain in the best way possible, and if necessary, we can provide a prescription for additional pain medication.   Day 1    Time  Name of Medication Number of pills taken  Amount of Acetaminophen  Pain Level   Comments  AM PM       AM PM       AM PM       AM PM       AM PM       AM PM       AM PM       AM PM       Total Daily amount of Acetaminophen Do not take more than  3,000 mg per day      Day 2    Time  Name of Medication Number of pills taken  Amount of Acetaminophen  Pain Level   Comments  AM  PM       AM PM       AM PM       AM PM       AM PM       AM PM       AM PM       AM PM       Total Daily amount of Acetaminophen Do not take more than  3,000 mg per day      Day 3    Time  Name of Medication Number of pills taken  Amount of Acetaminophen  Pain Level   Comments  AM PM       AM PM       AM PM       AM PM          AM PM       AM PM       AM PM       AM PM       Total Daily amount of Acetaminophen Do not take more than  3,000 mg per day      Day 4    Time  Name of Medication Number of pills taken  Amount of Acetaminophen  Pain Level   Comments  AM PM       AM PM       AM PM       AM PM       AM PM       AM PM       AM PM       AM PM       Total Daily amount of Acetaminophen Do not take more than  3,000 mg per day      Day 5    Time  Name of Medication Number of pills taken  Amount of Acetaminophen  Pain Level   Comments  AM PM       AM PM       AM PM       AM PM       AM PM       AM PM       AM PM         AM PM       Total Daily amount of Acetaminophen Do not take more than  3,000 mg per day       Day 6    Time  Name of Medication Number of pills taken  Amount of Acetaminophen  Pain Level  Comments  AM PM       AM PM       AM PM       AM PM       AM PM       AM PM       AM PM       AM PM       Total Daily amount of Acetaminophen Do not take more than  3,000 mg per day      Day 7    Time  Name of Medication Number of pills taken  Amount of Acetaminophen  Pain Level   Comments  AM PM       AM PM       AM PM       AM PM       AM PM       AM PM       AM PM       AM PM       Total Daily amount of Acetaminophen Do not take more than  3,000 mg per day        For additional information about how and where to safely dispose of unused opioid medications - https://www.morepowerful[employer].org  Disclaimer: This document contains information and/or instructional materials adapted from [facility]  for the typical patient with your condition. It does not replace medical advice from your health care provider because your experience may differ from that of the typical patient. Talk to your health care provider if you have any questions about this document, your condition or your treatment plan. Adapted from [facility]  

## 2020-04-05 NOTE — Anesthesia Postprocedure Evaluation (Signed)
Anesthesia Post Note  Patient: Casey Murray  Procedure(s) Performed: APPENDECTOMY LAPAROSCOPIC (N/A )     Patient location during evaluation: PACU Anesthesia Type: General Level of consciousness: awake and alert Pain management: pain level controlled Vital Signs Assessment: post-procedure vital signs reviewed and stable Respiratory status: spontaneous breathing, nonlabored ventilation, respiratory function stable and patient connected to nasal cannula oxygen Cardiovascular status: blood pressure returned to baseline and stable Postop Assessment: no apparent nausea or vomiting Anesthetic complications: no   No complications documented.  Last Vitals:  Vitals:   04/05/20 1126 04/05/20 1130  BP: 140/89 (!) 138/93  Pulse: (!) 109 (!) 106  Resp: 16 (!) 21  Temp: 36.4 C   SpO2: 100% 100%    Last Pain:  Vitals:   04/05/20 1126  TempSrc:   PainSc: 0-No pain                 Maronda Caison S

## 2020-04-05 NOTE — ED Provider Notes (Signed)
[facility]-EMERGENCY DEPT Provider Note   CSN: [id number] Arrival date & time: 04/05/20  0038     History Chief Complaint  Patient presents with   Abdominal Pain    Casey Murray is a 39 y.o. female.  HPI     This is a 39 year old otherwise healthy female who presents with abdominal pain.  Reports onset of symptoms yesterday morning.  She states that the pain started out as crampy mid and upper abdominal pain that radiated across her abdomen.  She took Advil with minimal relief.  Since that time it has progressed and now has moved to the right lower quadrant.  She reports nausea without vomiting.  She also reports decreased p.o. intake.  She states certain positions make her pain worse.  She has not noted any urinary symptoms.  She does not believe herself to be pregnant.  She currently rates her pain at 8 out of 10.  History reviewed. No pertinent past medical history.  There are no problems to display for this patient.   Past Surgical History:  Procedure Laterality Date   KNEE SURGERY     LAPAROSCOPY       OB History    Gravida  1   Para  1   Term  1   Preterm  0   AB  0   Living  1     SAB  0   TAB  0   Ectopic  0   Multiple  0   Live Births  1           Family History  Problem Relation Age of Onset   Heart disease Father    Hypertension Maternal Grandfather    Diabetes Maternal Grandfather     Social History   Tobacco Use   Smoking status: Never Smoker   Smokeless tobacco: Never Used  Vaping Use   Vaping Use: Never used  Substance Use Topics   Alcohol use: No   Drug use: No    Home Medications Prior to Admission medications   Medication Sig Start Date End Date Taking? Authorizing Provider  ibuprofen (ADVIL,MOTRIN) 600 MG tablet Take 1 tablet (600 mg total) by mouth every 6 (six) hours. 05/15/12   Julio Sicks, NP  Prenatal Vit-Fe Fumarate-FA (PRENATAL MULTIVITAMIN) TABS Take 1 tablet by mouth  daily.    [provider]    Allergies    Prochlorperazine edisylate and Compazine  Review of Systems   Review of Systems  Constitutional: Negative for fever.  Respiratory: Negative for shortness of breath.   Cardiovascular: Negative for chest pain.  Gastrointestinal: Positive for abdominal pain and nausea. Negative for constipation, diarrhea and vomiting.  Genitourinary: Negative for dysuria.  All other systems reviewed and are negative.   Physical Exam Updated Vital Signs BP (!) 148/103 (BP Location: Left Arm)    Pulse (!) 120    Temp 99 F (37.2 C) (Oral)    Resp 18    Ht 1.575 m (5\' 2" )    Wt 73.5 kg    LMP 03/06/2020 Comment: negative beta HCG 03/05/20   SpO2 100%    BMI 29.63 kg/m   Physical Exam Vitals and nursing note reviewed.  Constitutional:      Appearance: She is well-developed. She is not ill-appearing.  HENT:     Head: Normocephalic and atraumatic.     Mouth/Throat:     Mouth: Mucous membranes are moist.  Eyes:     Pupils: Pupils are  equal, round, and reactive to light.  Cardiovascular:     Rate and Rhythm: Regular rhythm. Tachycardia present.     Heart sounds: Normal heart sounds.  Pulmonary:     Effort: Pulmonary effort is normal. No respiratory distress.     Breath sounds: No wheezing.  Abdominal:     General: Bowel sounds are normal.     Palpations: Abdomen is soft.     Tenderness: There is abdominal tenderness in the right lower quadrant. There is rebound. Positive signs include Rovsing's sign.  Musculoskeletal:     Cervical back: Neck supple.  Skin:    General: Skin is warm and dry.  Neurological:     Mental Status: She is alert and oriented to person, place, and time.  Psychiatric:        Mood and Affect: Mood normal.     ED Results / Procedures / Treatments   Labs (all labs ordered are listed, but only abnormal results are displayed) Labs Reviewed  COMPREHENSIVE METABOLIC PANEL - Abnormal; Notable for the following components:       Result Value   CO2 19 (*)    Glucose, Bld 126 (*)    Total Protein 8.3 (*)    Albumin 5.4 (*)    Total Bilirubin 1.5 (*)    All other components within normal limits  CBC - Abnormal; Notable for the following components:   WBC 18.7 (*)    All other components within normal limits  URINALYSIS, ROUTINE W REFLEX MICROSCOPIC - Abnormal; Notable for the following components:   Hgb urine dipstick MODERATE (*)    Leukocytes,Ua TRACE (*)    Bacteria, UA RARE (*)    All other components within normal limits  SARS CORONAVIRUS 2 BY RT PCR (HOSPITAL ORDER, PERFORMED IN [facility])  LIPASE, BLOOD  I-STAT BETA HCG BLOOD, ED ([facility], [facility], [facility] ONLY)    EKG None  Radiology CT ABDOMEN PELVIS W CONTRAST  Result Date: 04/05/2020 CLINICAL DATA:  Right lower quadrant pain EXAM: CT ABDOMEN AND PELVIS WITH CONTRAST TECHNIQUE: Multidetector CT imaging of the abdomen and pelvis was performed using the standard protocol following bolus administration of intravenous contrast. CONTRAST:  OMNIPAQUE IOHEXOL 300 MG/ML  SOLN COMPARISON:  None. FINDINGS: Lower chest: The visualized heart size within normal limits. No pericardial fluid/thickening. No hiatal hernia. The visualized portions of the lungs are clear. Hepatobiliary: The liver is normal in density without focal abnormality.The main portal vein is patent. No evidence of calcified gallstones, gallbladder wall thickening or biliary dilatation. Pancreas: Unremarkable. No pancreatic ductal dilatation or surrounding inflammatory changes. Spleen: Normal in size without focal abnormality. Adrenals/Urinary Tract: Both adrenal glands appear normal. The kidneys and collecting system appear normal without evidence of urinary tract calculus or hydronephrosis. Bladder is unremarkable. Stomach/Bowel: The stomach and small bowel are normal in appearance. There is a dilated appendix seen within the right lower quadrant measuring up to 9 mm in transverse dimension.  There is diffuse wall thickening of the appendix with surrounding fat stranding changes. No loculated fluid collections free air is seen. Vascular/Lymphatic: There are no enlarged mesenteric, retroperitoneal, or pelvic lymph nodes. No significant vascular findings are present. Reproductive: IUD seen within the retroflexed uterus. Other: No evidence of abdominal wall mass or hernia. Musculoskeletal: No acute or significant osseous findings. IMPRESSION: Findings consistent with acute appendicitis. No pericolonic loculated fluid collections or free air. Electronically Signed   By: Jonna Clark M.D.   On: 04/05/2020 02:35  Procedures Procedures (including critical care time)  Medications Ordered in ED Medications  cefTRIAXone (ROCEPHIN) 1 g in sodium chloride 0.9 % 100 mL IVPB (has no administration in time range)  metroNIDAZOLE (FLAGYL) IVPB 500 mg (has no administration in time range)  sodium chloride 0.9 % bolus 1,000 mL (1,000 mLs Intravenous New Bag/Given 04/05/20 0242)  morphine 4 MG/ML injection 4 mg (4 mg Intravenous Given 04/05/20 0238)  ondansetron (ZOFRAN) injection 4 mg (4 mg Intravenous Given 04/05/20 0240)  iohexol (OMNIPAQUE) 300 MG/ML solution 100 mL (100 mLs Intravenous Contrast Given 04/05/20 0222)  sodium chloride (PF) 0.9 % injection (  Given by Other 04/05/20 0247)    ED Course  I have reviewed the triage vital signs and the nursing notes.  Pertinent labs & imaging results that were available during my care of the patient were reviewed by me and considered in my medical decision making (see chart for details).    MDM Rules/Calculators/A&P                          Patient presents with gradually worsening abdominal pain that now localizes to the right lower quadrant.  She is overall nontoxic.  Vital signs notable for pulse rate in the 120s.  This may be pain related.  She has localized peritonitis and a positive Rovsing's.  Her history and physical exam are highly suggestive for  acute appendicitis.  Other considerations include ovarian pathology, UTI, kidney stone.  Patient was given pain and nausea medication.  Work-up reviewed from triage.  Notably has a leukocytosis to 18.  CT scan was obtained to evaluate for acute appendicitis.  CT confirms acute uncomplicated appendicitis.  She was given Rocephin and Flagyl.  Will consult with general surgery.  Patient was updated on diagnosis and plan of care.  Covid testing pending.   Final Clinical Impression(s) / ED Diagnoses Final diagnoses:  Acute appendicitis with localized peritonitis, without perforation, abscess, or gangrene    Rx / DC Orders ED Discharge Orders    None       Shon Baton, MD 04/05/20 [phone]

## 2020-04-05 NOTE — Transfer of Care (Signed)
Immediate Anesthesia Transfer of Care Note  Patient: Casey Murray  Procedure(s) Performed: APPENDECTOMY LAPAROSCOPIC (N/A )  Patient Location: PACU  Anesthesia Type:General  Level of Consciousness: awake and alert   Airway & Oxygen Therapy: Patient Spontanous Breathing and Patient connected to face mask oxygen  Post-op Assessment: Report given to RN and Post -op Vital signs reviewed and stable  Post vital signs: Reviewed and stable  Last Vitals:  Vitals Value Taken Time  BP    Temp    Pulse 109 04/05/20 1126  Resp 16 04/05/20 1126  SpO2 100 % 04/05/20 1126  Vitals shown include unvalidated device data.  Last Pain:  Vitals:   04/05/20 1000  TempSrc: Oral  PainSc:       Patients Stated Pain Goal: 3 (04/05/20 0953)  Complications: No complications documented.

## 2020-04-05 NOTE — Anesthesia Procedure Notes (Signed)
Procedure Name: Intubation Date/Time: 04/05/2020 10:31 AM Performed by: Florene Route, CRNA Patient Re-evaluated:Patient Re-evaluated prior to induction Oxygen Delivery Method: Circle system utilized Preoxygenation: Pre-oxygenation with 100% oxygen Induction Type: IV induction, Rapid sequence and Cricoid Pressure applied Laryngoscope Size: Miller and 2 Grade View: Grade I Tube type: Oral Tube size: 7.5 mm Number of attempts: 1 Airway Equipment and Method: Stylet Placement Confirmation: ETT inserted through vocal cords under direct vision,  positive ETCO2 and breath sounds checked- equal and bilateral Secured at: 21 cm Tube secured with: Tape Dental Injury: Teeth and Oropharynx as per pre-operative assessment

## 2020-04-05 NOTE — Op Note (Signed)
Preoperative diagnosis: acute appendicitis with peritonitis  Postoperative diagnosis: Same   Procedure: laparoscopic appendectomy  Surgeon: Feliciana Rossetti, M.D.  Asst: none  Anesthesia: Gen.   Indications for procedure: Casey Murray is a 39 y.o. female with symptoms of pain in right lower quadrant and nausea consistent with acute appendicitis. Confirmed by CT scan and laboratory values.  Description of procedure: The patient was brought into the operative suite, placed supine. Anesthesia was administered with endotracheal tube. The patient's left arm was tucked. All pressure points were offloaded by foam padding. The patient was prepped and draped in the usual sterile fashion.  A transverse incision was made to the left of the umbilicus and a 48mm trocar was [place]. Pneumoperitoneum was applied with high flow low pressure.  2 54mm trocars were placed, one in the suprapubic space, one in the LLQ, the periumbilical incision was then up-sized and a 60mm trocar placed in that space. A transversus abdominal block was placed on the left and right sides. Next, the patient was placed in trendelenberg, rotated to the left. The omentum was retracted cephalad. The cecum and appendix were identified. The appendix was inflamed with some fibrinous tissue surrounding it. There was no evidence of perforation. The base of the appendix was dissected and a window through the mesoappendix was created with blunt dissection. Large Hem-o-lock clips were used to doubly ligate the base of the appendix and mesoappendix. The appendix was cut free with scissors.  The appendix was placed in a specimen bag. The pelvis and RLQ were irrigated. No purulence was seen in the pelvis. The ovaries were visualized and had a few small cysts but otherwise normal. The appendix was removed via the umbilicus. 0 vicryl was used to close the fascial defect. Pneumoperitoneum was removed, all trocars were removed. All incisions were closed with 4-0  monocryl subcuticular stitch. The patient woke from anesthesia and was brought to PACU in stable condition.  Findings: acute appendicitis without perforation  Specimen: appendix  Blood loss: 20 ml  Local anesthesia: 30 ml marcaine  Complications: none  Images:     Feliciana Rossetti, M.D. General, Bariatric, & Minimally Invasive Surgery [facility]

## 2020-04-05 NOTE — ED Notes (Signed)
Patient in CT

## 2020-04-05 NOTE — H&P (Signed)
Reason for Consult: abdominal pain Referring Physician: [name] is an 39 y.o. female.  HPI: 39 yo female with 1 day of abdominal pain. Pain started epigastric and transitioned to RLQ pain. Pain is constant and sharp. The pain medications helped some. The pain does not radiate. She has never had a similar pain in the past. She denies vomiting or diarrhea. She does have vomiting. She denies fevers.   History reviewed. No pertinent past medical history.  Past Surgical History:  Procedure Laterality Date  . KNEE SURGERY    . LAPAROSCOPY      Family History  Problem Relation Age of Onset  . Heart disease Father   . Hypertension Maternal Grandfather   . Diabetes Maternal Grandfather     Social History:  reports that she has never smoked. She has never used smokeless tobacco. She reports that she does not drink alcohol and does not use drugs.  Allergies:  Allergies  Allergen Reactions  . Prochlorperazine Edisylate     Other reaction(s): Myalgias (Muscle Pain), Other Muscle spasms   . Compazine Other (See Comments)    Causes muscle spasms.    Medications: I have reviewed the patient's current medications.  Results for orders placed or performed during the hospital encounter of 04/05/20 (from the past 48 hour(s))  Lipase, blood     Status: None   Collection Time: 04/05/20  1:02 AM  Result Value Ref Range   Lipase 21 11 - 51 U/L    Comment: Performed at [facility], [address], [place], [place] [address]  Comprehensive metabolic panel     Status: Abnormal   Collection Time: 04/05/20  1:02 AM  Result Value Ref Range   Sodium 138 135 - 145 mmol/L   Potassium 3.6 3.5 - 5.1 mmol/L   Chloride 106 98 - 111 mmol/L   CO2 19 (L) 22 - 32 mmol/L   Glucose, Bld 126 (H) 70 - 99 mg/dL    Comment: Glucose reference range applies only to samples taken after fasting for at least 8 hours.   BUN 11 6 - 20 mg/dL   Creatinine, Ser 9.56 0.44 -  1.00 mg/dL   Calcium 21.3 8.9 - 08.6 mg/dL   Total Protein 8.3 (H) 6.5 - 8.1 g/dL   Albumin 5.4 (H) 3.5 - 5.0 g/dL   AST 16 15 - 41 U/L   ALT 17 0 - 44 U/L   Alkaline Phosphatase 60 38 - 126 U/L   Total Bilirubin 1.5 (H) 0.3 - 1.2 mg/dL   GFR calc non Af Amer >60 >60 mL/min   GFR calc Af Amer >60 >60 mL/min   Anion gap 13 5 - 15    Comment: Performed at [facility], [address] [place], [place] [address]  CBC     Status: Abnormal   Collection Time: 04/05/20  1:02 AM  Result Value Ref Range   WBC 18.7 (H) 4.0 - 10.5 K/uL   RBC 4.94 3.87 - 5.11 MIL/uL   Hemoglobin 14.9 12.0 - 15.0 g/dL   HCT 96.2 36 - 46 %   MCV 85.0 80.0 - 100.0 fL   MCH 30.2 26.0 - 34.0 pg   MCHC 35.5 30.0 - 36.0 g/dL   RDW 95.2 84.1 - 32.4 %   Platelets 317 150 - 400 K/uL   nRBC 0.0 0.0 - 0.2 %    Comment: Performed at [facility], [address]  [place], [place] [address]  I-Stat beta hCG blood, ED     Status: None   Collection Time: 04/05/20  1:07 AM  Result Value Ref Range   I-stat hCG, quantitative <5.0 <5 mIU/mL   Comment 3            Comment:   GEST. AGE      CONC.  (mIU/mL)   <=1 WEEK        5 - 50     2 WEEKS       50 - 500     3 WEEKS       100 - 10,000     4 WEEKS     1,000 - 30,000        FEMALE AND NON-PREGNANT FEMALE:     LESS THAN 5 mIU/mL   Urinalysis, Routine w reflex microscopic Urine, Clean Catch     Status: Abnormal   Collection Time: 04/05/20  1:15 AM  Result Value Ref Range   Color, Urine YELLOW YELLOW   APPearance CLEAR CLEAR   Specific Gravity, Urine 1.008 1.005 - 1.030   pH 7.0 5.0 - 8.0   Glucose, UA NEGATIVE NEGATIVE mg/dL   Hgb urine dipstick MODERATE (A) NEGATIVE   Bilirubin Urine NEGATIVE NEGATIVE   Ketones, ur NEGATIVE NEGATIVE mg/dL   Protein, ur NEGATIVE NEGATIVE mg/dL   Nitrite NEGATIVE NEGATIVE   Leukocytes,Ua TRACE (A) NEGATIVE   RBC / HPF 0-5 0 - 5 RBC/hpf   WBC, UA 0-5 0 - 5 WBC/hpf   Bacteria, UA RARE (A) NONE  SEEN   Squamous Epithelial / LPF 0-5 0 - 5   Mucus PRESENT     Comment: Performed at [facility], [address] [place], [place] [address]  SARS Coronavirus 2 by RT PCR (hospital order, performed in [facility] hospital lab) Nasopharyngeal Nasopharyngeal Swab     Status: None   Collection Time: 04/05/20  4:28 AM   Specimen: Nasopharyngeal Swab  Result Value Ref Range   SARS Coronavirus 2 NEGATIVE NEGATIVE    Comment: (NOTE) SARS-CoV-2 target nucleic acids are NOT DETECTED.  The SARS-CoV-2 RNA is generally detectable in upper and lower respiratory specimens during the acute phase of infection. The lowest concentration of SARS-CoV-2 viral copies this assay can detect is 250 copies / mL. A negative result does not preclude SARS-CoV-2 infection and should not be used as the sole basis for treatment or other patient management decisions.  A negative result may occur with improper specimen collection / handling, submission of specimen other than nasopharyngeal swab, presence of viral mutation(s) within the areas targeted by this assay, and inadequate number of viral copies (<250 copies / mL). A negative result must be combined with clinical observations, patient history, and epidemiological information.  Fact Sheet for Patients:   BoilerBrush.com.cy  Fact Sheet for Healthcare Providers: https://pope.com/  This test is not yet approved or  cleared by the [place] FDA and has been authorized for detection and/or diagnosis of SARS-CoV-2 by FDA under an Emergency Use Authorization (EUA).  This EUA will remain in effect (meaning this test can be used) for the duration of the COVID-19 declaration under Section 564(b)(1) of the Act, 21 U.S.C. section 360bbb-3(b)(1), unless the authorization is terminated or revoked sooner.  Performed at [facility], [address] [place], [place] [address]      CT ABDOMEN PELVIS W CONTRAST  Result Date: 04/05/2020 CLINICAL DATA:  Right lower quadrant pain EXAM: CT ABDOMEN  AND PELVIS WITH CONTRAST TECHNIQUE: Multidetector CT imaging of the abdomen and pelvis was performed using the standard protocol following bolus administration of intravenous contrast. CONTRAST:  OMNIPAQUE IOHEXOL 300 MG/ML  SOLN COMPARISON:  None. FINDINGS: Lower chest: The visualized heart size within normal limits. No pericardial fluid/thickening. No hiatal hernia. The visualized portions of the lungs are clear. Hepatobiliary: The liver is normal in density without focal abnormality.The main portal vein is patent. No evidence of calcified gallstones, gallbladder wall thickening or biliary dilatation. Pancreas: Unremarkable. No pancreatic ductal dilatation or surrounding inflammatory changes. Spleen: Normal in size without focal abnormality. Adrenals/Urinary Tract: Both adrenal glands appear normal. The kidneys and collecting system appear normal without evidence of urinary tract calculus or hydronephrosis. Bladder is unremarkable. Stomach/Bowel: The stomach and small bowel are normal in appearance. There is a dilated appendix seen within the right lower quadrant measuring up to 9 mm in transverse dimension. There is diffuse wall thickening of the appendix with surrounding fat stranding changes. No loculated fluid collections free air is seen. Vascular/Lymphatic: There are no enlarged mesenteric, retroperitoneal, or pelvic lymph nodes. No significant vascular findings are present. Reproductive: IUD seen within the retroflexed uterus. Other: No evidence of abdominal wall mass or hernia. Musculoskeletal: No acute or significant osseous findings. IMPRESSION: Findings consistent with acute appendicitis. No pericolonic loculated fluid collections or free air. Electronically Signed   By: Jonna Clark M.D.   On: 04/05/2020 02:35    Review of Systems  Constitutional: Negative for chills and  fever.  HENT: Negative for hearing loss.   Eyes: Negative for blurred vision and double vision.  Respiratory: Negative for cough and hemoptysis.   Cardiovascular: Negative for chest pain and palpitations.  Gastrointestinal: Positive for abdominal pain and nausea. Negative for vomiting.  Genitourinary: Negative for dysuria and urgency.  Musculoskeletal: Negative for myalgias and neck pain.  Skin: Negative for itching and rash.  Neurological: Negative for dizziness, tingling and headaches.  Endo/Heme/Allergies: Does not bruise/bleed easily.  Psychiatric/Behavioral: Negative for depression and suicidal ideas.    PE Blood pressure (!) 120/92, pulse 96, temperature 98.7 F (37.1 C), resp. rate 18, height 5\' 2"  (1.575 m), weight 73.5 kg, last menstrual period 03/06/2020, SpO2 97 %, unknown if currently breastfeeding. Constitutional: NAD; conversant; no deformities Eyes: Moist conjunctiva; no lid lag; anicteric; PERRL Neck: Trachea midline; no thyromegaly Lungs: Normal respiratory effort; no tactile fremitus CV: RRR; no palpable thrills; no pitting edema GI: Abd right lower quadrant tenderness; no palpable hepatosplenomegaly MSK: Normal gait; no clubbing/cyanosis Psychiatric: Appropriate affect; alert and oriented x3 Lymphatic: No palpable cervical or axillary lymphadenopathy   Assessment/Plan: 39 yo female with acute appendicitis with leukocytosis and CT findings. -we discussed the details of the procedure; that it would be done under general anesthesia, that we would attempt to do the procedure laparoscopically. That the appendix would be isolated from the large and small intestine and then ligated and removed. We discussed the reason for this is to avoid rupture and infection and resolve the pains. We discussed risks of infection, abscess, injury to intestines or urinary structures, and need for open incision. She showed good understanding and wanted to proceed. -IV abx -OR today for lap  appendectomy -planned outpatient procedure  [address]aut 04/05/2020, 8:01 AM

## 2020-04-06 ENCOUNTER — Encounter (HOSPITAL_COMMUNITY): Payer: Self-pay | Admitting: General Surgery

## 2020-04-06 LAB — SURGICAL PATHOLOGY

## 2020-11-08 ENCOUNTER — Other Ambulatory Visit: Payer: Self-pay | Admitting: Obstetrics and Gynecology

## 2020-11-08 DIAGNOSIS — R928 Other abnormal and inconclusive findings on diagnostic imaging of breast: Secondary | ICD-10-CM

## 2020-11-29 ENCOUNTER — Other Ambulatory Visit: Payer: Self-pay

## 2020-11-29 ENCOUNTER — Ambulatory Visit
Admission: RE | Admit: 2020-11-29 | Discharge: 2020-11-29 | Disposition: A | Discharge status: Home / Self Care | Payer: BC Managed Care – PPO | Source: Ambulatory Visit | Attending: Obstetrics and Gynecology | Admitting: Obstetrics and Gynecology

## 2020-11-29 DIAGNOSIS — R928 Other abnormal and inconclusive findings on diagnostic imaging of breast: Secondary | ICD-10-CM

## 2021-10-30 ENCOUNTER — Other Ambulatory Visit: Payer: Self-pay

## 2021-10-30 ENCOUNTER — Ambulatory Visit (INDEPENDENT_AMBULATORY_CARE_PROVIDER_SITE_OTHER): Payer: BC Managed Care – PPO

## 2021-10-30 ENCOUNTER — Ambulatory Visit (INDEPENDENT_AMBULATORY_CARE_PROVIDER_SITE_OTHER): Payer: BC Managed Care – PPO | Admitting: Sports Medicine

## 2021-10-30 VITALS — BP 118/80 | HR 79 | Ht 62.0 in | Wt 171.0 lb

## 2021-10-30 DIAGNOSIS — M25529 Pain in unspecified elbow: Secondary | ICD-10-CM

## 2021-10-30 DIAGNOSIS — M7712 Lateral epicondylitis, left elbow: Secondary | ICD-10-CM | POA: Diagnosis not present

## 2021-10-30 DIAGNOSIS — M25522 Pain in left elbow: Secondary | ICD-10-CM

## 2021-10-30 MED ORDER — MELOXICAM 15 MG PO TABS: 15.0000 mg | ORAL_TABLET | Freq: Every day | ORAL | 0 refills | Status: DC

## 2021-10-30 NOTE — Patient Instructions (Addendum)
Good to see you  ?- Start meloxicam 15 mg daily x2 weeks.  If still having pain after 2 weeks, complete 3rd-week of meloxicam. May use remaining meloxicam as needed once daily for pain control.  Do not to use additional NSAIDs while taking meloxicam.  May use Tylenol [phone] mg 2 to 3 times a day for breakthrough pain. ?Wear wrist brace while active and sleeping ?As needed follow up if no improvement 2-3 week follow up  ?

## 2021-10-30 NOTE — Progress Notes (Signed)
? ? Casey Murray D.Casey Murray ?[facility] ?[address] [place] [address] ?Phone: [phone] ?  ?Assessment and Plan:   ?  ?1. Left elbow pain ?2. Lateral epicondylitis of left elbow ?-Acute, uncomplicated, initial sports medicine visit ?- Likely lateral epicondylitis caused from contusion to lateral elbow from fall ?- Recommend using wrist brace to limit wrist extension for the next 2 weeks ?- Start meloxicam 15 mg daily x2 weeks.  If still having pain after 2 weeks, complete 3rd-week of meloxicam. May use remaining meloxicam as needed once daily for pain control.  Do not to use additional NSAIDs while taking meloxicam.  May use Tylenol [id number] mg 2 to 3 times a day for breakthrough pain. ?- X-ray obtained in clinic.  My interpretation: No acute fracture or dislocation.  Unremarkable exam ?  ?Pertinent previous records reviewed include none ?  ?Follow Up: As needed if no improvement or worsening of symptoms ?  ?Subjective:   ?I, Casey Murray, am serving as a Neurosurgeon for [employer] ? ?Chief Complaint: left  arm pain  ? ?HPI:  ? ?10/30/21 ?Patient is a 41 year old female complaining of  arm pain. Patient states 2 weeks ago slipped on some water and landed on her elbow , no numbness or tingling, no radiating pain , isn't able to lift thing with pain while are is extended, has not been taking meds on a regular basis  ? ?Relevant Historical Information: None pertinent ? ?Additional pertinent review of systems negative. ? ? ?Current Outpatient Medications:  ?  meloxicam (MOBIC) 15 MG tablet, Take 1 tablet (15 mg total) by mouth daily., Disp: 30 tablet, Rfl: 0 ?  ZOLMitriptan (ZOMIG) 2.5 MG tablet, Take 2.5 mg by mouth as needed for migraine. , Disp: , Rfl:  ?  zonisamide (ZONEGRAN) 50 MG capsule, Take 150 mg by mouth at bedtime., Disp: , Rfl:  ?  AJOVY 225 MG/1.5ML SOAJ, Inject 1.5 mLs into the skin every 30 (thirty) days. (Patient not taking: Reported on 10/30/2021), Disp:  , Rfl:  ?  baclofen (LIORESAL) 10 MG tablet, Take 10 mg by mouth 3 (three) times daily as needed for muscle spasms.  (Patient not taking: Reported on 10/30/2021), Disp: , Rfl:  ?  ibuprofen (ADVIL) 800 MG tablet, Take 1 tablet (800 mg total) by mouth every 8 (eight) hours as needed. (Patient not taking: Reported on 10/30/2021), Disp: 30 tablet, Rfl: 0 ?  imipramine (TOFRANIL) 25 MG tablet, Take 50 mg by mouth at bedtime. (Patient not taking: Reported on 10/30/2021), Disp: , Rfl:  ?  Magnesium 400 MG TABS, Take 1 tablet by mouth daily.  (Patient not taking: Reported on 10/30/2021), Disp: , Rfl:  ?  UBRELVY 100 MG TABS, Take 100 mg by mouth as needed (migraine). May repeat in 2 hours if no relief. Max of 2 tabs in 24 hours (Patient not taking: Reported on 10/30/2021), Disp: , Rfl:  ?  vitamin B-12 (CYANOCOBALAMIN) 100 MCG tablet, Take 100 mcg by mouth daily. (Patient not taking: Reported on 10/30/2021), Disp: , Rfl:   ? ?Objective:   ?  ?Vitals:  ? 10/30/21 1048  ?BP: 118/80  ?Pulse: 79  ?SpO2: 98%  ?Weight: 171 lb (77.6 kg)  ?Height: 5\' 2"  (1.575 m)  ?  ?  ?Body mass index is 31.28 kg/m?.  ?  ?Physical Exam:   ? ?General: Appears well, no acute distress, nontoxic and pleasant ?Neck: FROM, no pain ?Neuro: sensation is intact distally with no  deficits, strenghth is 5/5 in elbow flexors/extenders/supinator/pronators and wrist flexors/extensors ?Psych: no evidence of anxiety or depression ? ?Left ELBOW: no deformity, swelling or muscle wasting ?Normal Carrying angle ?ROM:0-140, supination and pronation 90 ?TTP lateral epicondyle ?NTTP over triceps, ticeps tendon, olecronon,   medial epicondyle, antecubital fossa, biceps tendon,  ?Negative tinnels over cubital tunnel ?  pain with resisted wrist and middle digit extension ?No pain with resisted wrist flexion ?Mild pain with resisted supination ?Mild pain with resisted pronation ?Negative valgus stress ?Negative varus stress ?Negative milking maneuver  ? ? ?Electronically signed  by:  ?Casey Murray D.Casey Murray ?[facility] ?11:26 AM 10/30/21 ?

## 2023-04-30 ENCOUNTER — Other Ambulatory Visit: Payer: Self-pay | Admitting: Obstetrics and Gynecology

## 2023-04-30 DIAGNOSIS — N6452 Nipple discharge: Secondary | ICD-10-CM

## 2023-05-08 ENCOUNTER — Ambulatory Visit
Admission: RE | Admit: 2023-05-08 | Discharge: 2023-05-08 | Disposition: A | Discharge status: Home / Self Care | Payer: BC Managed Care – PPO | Source: Ambulatory Visit | Attending: Obstetrics and Gynecology | Admitting: Obstetrics and Gynecology

## 2023-05-08 ENCOUNTER — Other Ambulatory Visit: Payer: Self-pay | Admitting: Obstetrics and Gynecology

## 2023-05-08 DIAGNOSIS — N63 Unspecified lump in unspecified breast: Secondary | ICD-10-CM

## 2023-05-08 DIAGNOSIS — N6452 Nipple discharge: Secondary | ICD-10-CM

## 2023-05-12 ENCOUNTER — Ambulatory Visit
Admission: RE | Admit: 2023-05-12 | Discharge: 2023-05-12 | Disposition: A | Discharge status: Home / Self Care | Payer: BC Managed Care – PPO | Source: Ambulatory Visit | Attending: Obstetrics and Gynecology | Admitting: Obstetrics and Gynecology

## 2023-05-12 DIAGNOSIS — N6452 Nipple discharge: Secondary | ICD-10-CM

## 2023-05-12 DIAGNOSIS — N63 Unspecified lump in unspecified breast: Secondary | ICD-10-CM

## 2023-05-12 HISTORY — PX: BREAST BIOPSY: SHX20

## 2023-05-13 LAB — SURGICAL PATHOLOGY

## 2023-05-27 ENCOUNTER — Ambulatory Visit: Payer: Self-pay | Admitting: Surgery

## 2023-05-27 ENCOUNTER — Other Ambulatory Visit: Payer: Self-pay | Admitting: Surgery

## 2023-05-27 DIAGNOSIS — N6341 Unspecified lump in right breast, subareolar: Secondary | ICD-10-CM

## 2023-05-27 DIAGNOSIS — N6452 Nipple discharge: Secondary | ICD-10-CM

## 2023-05-27 NOTE — H&P (Signed)
Subjective    Chief Complaint: New Consultation       History of Present Illness: Casey Murray STATES is a 42 y.o. female who is seen today as an office consultation at the request of Dr. Renaldo Fiddler for evaluation of New Consultation .     This is a 42 year old female with a past medical history of multiple sclerosis that is well-managed as well as chronic migraine headaches who presents with 61-month history of intermittent bloody discharge from the right nipple.  The patient reports no associated pain or palpable masses.  She has no family history of breast cancer.  She underwent mammogram and ultrasound which revealed a 6 mm mass at the base of the right nipple located 12:00 in the retroareolar region.  She had an attempt at biopsy of this area that obtained only benign breast tissue.  She presents now to discuss excision of this area.  She did have significant bruising and hematoma after the biopsy.     Review of Systems: A complete review of systems was obtained from the patient.  I have reviewed this information and discussed as appropriate with the patient.  See HPI as well for other ROS.   Review of Systems  Constitutional: Negative.   HENT: Negative.    Eyes: Negative.   Respiratory: Negative.    Cardiovascular: Negative.   Gastrointestinal: Negative.   Genitourinary: Negative.   Musculoskeletal: Negative.   Skin: Negative.   Neurological: Negative.   Endo/Heme/Allergies: Negative.   Psychiatric/Behavioral: Negative.          Medical History: Past Medical History      Past Medical History:  Diagnosis Date   Anxiety          Problem List     Patient Active Problem List  Diagnosis   Multiple sclerosis (CMS/HHS-HCC)   Chronic headaches   Subareolar mass of right breast   Bloody discharge from right nipple        Past Surgical History       Past Surgical History:  Procedure Laterality Date   APPENDECTOMY       ARTHROSCOPIC REPAIR ACL            Allergies        Allergies  Allergen Reactions   Prochlorperazine Muscle Pain and Other (See Comments)      Other reaction(s): Myalgias (Muscle Pain), Other  Muscle spasms   Causes muscle spasms.   Muscle spasms   Muscle spasm        Medications Ordered Prior to Encounter        Current Outpatient Medications on File Prior to Visit  Medication Sig Dispense Refill   cyanocobalamin (VITAMIN B12) 100 MCG tablet Take 100 mcg by mouth once daily       escitalopram oxalate (LEXAPRO) 10 MG tablet Take 1.5 tablets by mouth once daily       meloxicam (MOBIC) 15 MG tablet Take 1 tablet by mouth once daily       perphenazine 4 MG tablet TAKE 1-2 TABS AS NEEDED EVERY 2-3 HOURS FOR HEADACHE RESCUE MAX 4 TABS/24 HOURS       rizatriptan (MAXALT) 10 MG tablet TAKE 1 TAB AS NEEDED MIGRAINE MAY REPEAT ONCE AFTER 2 HOURS       zonisamide (ZONEGRAN) 100 MG capsule Take 2 capsules by mouth once daily        No current facility-administered medications on file prior to visit.        Family History  History reviewed. No pertinent family history.      Tobacco Use History  Social History       Tobacco Use  Smoking Status Never  Smokeless Tobacco Never        Social History  Social History         Socioeconomic History   Marital status: Married  Tobacco Use   Smoking status: Never   Smokeless tobacco: Never  Substance and Sexual Activity   Alcohol use: Yes      Alcohol/week: 2.0 - 3.0 standard drinks of alcohol      Types: 2 - 3 Standard drinks or equivalent per week   Drug use: Never    Social Drivers of Acupuncturist Strain: Low Risk  (12/04/2022)    Received from [facility]    Overall Financial Resource Strain (CARDIA)     Difficulty of Paying Living Expenses: Not hard at all  Food Insecurity: No Food Insecurity (12/04/2022)    Received from [facility]    Hunger Vital Sign     Worried About Running Out of Food in the Last Year: Never true     Ran Out of Food in  the Last Year: Never true  Transportation Needs: No Transportation Needs (12/04/2022)    Received from [facility] - Transportation     Lack of Transportation (Medical): No     Lack of Transportation (Non-Medical): No  Physical Activity: Insufficiently Active (12/04/2022)    Received from [facility]    Exercise Vital Sign     Days of Exercise per Week: 3 days     Minutes of Exercise per Session: 30 min  Stress: No Stress Concern Present (12/04/2022)    Received from [facility] - Occupational Stress Questionnaire     Feeling of Stress : Not at all  Social Connections: Moderately Integrated (12/04/2022)    Received from [facility]    Social Network     How would you rate your social network (family, work, friends)?: Adequate participation with social networks  Housing Stability: Low Risk  (12/04/2022)    Received from [facility] Stability Vital Sign     Unable to Pay for Housing in the Last Year: No     Number of Places Lived in the Last Year: 1     In the last 12 months, was there a time when you did not have a steady place to sleep or slept in a shelter (including now)?: No        Objective:         Vitals:    05/27/23 1335  BP: 125/80  Pulse: 88  Temp: 36.7 C (98 F)  SpO2: 98%  Weight: 69.9 kg (154 lb)  Height: 154.9 cm (5\' 1" )    Body mass index is 29.1 kg/m.   Physical Exam    Constitutional:  WDWN in NAD, conversant, no obvious deformities; lying in bed comfortably Eyes:  Pupils equal, round; sclera anicteric; moist conjunctiva; no lid lag HENT:  Oral mucosa moist; good dentition  Neck:  No masses palpated, trachea midline; no thyromegaly Lungs:  CTA bilaterally; normal respiratory effort Breasts:  symmetric, no nipple changes; no nipple discharge.  Ecchymosis and palpable hematoma below the right nipple.  No other palpable masses or lymphadenopathy on either side CV:  Regular rate and  rhythm; no murmurs; extremities  well-perfused with no edema Abd:  +bowel sounds, soft, non-tender, no palpable organomegaly; no palpable hernias Musc: Normal gait; no apparent clubbing or cyanosis in extremities Lymphatic:  No palpable cervical or axillary lymphadenopathy Skin:  Warm, dry; no sign of jaundice Psychiatric - alert and oriented x 4; calm mood and affect     Labs, Imaging and Diagnostic Testing: CLINICAL DATA:  42 year old female with spontaneous unilateral bloody right nipple discharge.   EXAM: DIGITAL DIAGNOSTIC UNILATERAL RIGHT MAMMOGRAM WITH TOMOSYNTHESIS AND CAD; ULTRASOUND RIGHT BREAST LIMITED   TECHNIQUE: Right digital diagnostic mammography and breast tomosynthesis was performed. The images were evaluated with computer-aided detection. ; Targeted ultrasound examination of the right breast was performed   COMPARISON:  Previous exam(s).   ACR Breast Density Category c: The breasts are heterogeneously dense, which may obscure small masses.   FINDINGS: No suspicious masses or calcifications are seen in the right breast. Spot compression tomograms were performed of the central right breast with no definite abnormality seen.   Targeted ultrasound of the right breast was performed. There is a mass/intraductal mass at the base of the right nipple at 12 o'clock retroareolar measuring 0.4 x 0.6 x 0.3 cm. Normal lymph nodes are present in the right axilla.   IMPRESSION: 0.6 cm intraductal mass/mass at the base of the right nipple at 12 o'clock retroareolar in a patient with new onset spontaneous bloody right nipple discharge.   RECOMMENDATION: Recommend ultrasound-guided core biopsy of the 0.6 cm mass in the right breast at 12 o'clock retroareolar.   I have discussed the findings and recommendations with the patient. If applicable, a reminder letter will be sent to the patient regarding the next appointment.   BI-RADS CATEGORY  4: Suspicious.      Electronically Signed   By: Edwin Cap M.D.   On: 05/08/2023 10:18     Assessment and Plan:  Diagnoses and all orders for this visit:   Subareolar mass of right breast   Bloody discharge from right nipple     Recommend right breast radioactive seed localized lumpectomy to excise this intraductal mass below the nipple.   The surgical procedure has been discussed with the patient.  Potential risks, benefits, alternative treatments, and expected outcomes have been explained.  All of the patient's questions at this time have been answered.  The likelihood of reaching the patient's treatment goal is good.  The patient understands the proposed surgical procedure and wishes to proceed.       Alejandria Wessells Delbert Harness, MD  05/27/2023 4:20 PM

## 2023-05-27 NOTE — H&P (View-Only) (Signed)
Subjective    Chief Complaint: New Consultation       History of Present Illness: Casey Murray STATES is a 42 y.o. female who is seen today as an office consultation at the request of Dr. Renaldo Fiddler for evaluation of New Consultation .     This is a 42 year old female with a past medical history of multiple sclerosis that is well-managed as well as chronic migraine headaches who presents with 61-month history of intermittent bloody discharge from the right nipple.  The patient reports no associated pain or palpable masses.  She has no family history of breast cancer.  She underwent mammogram and ultrasound which revealed a 6 mm mass at the base of the right nipple located 12:00 in the retroareolar region.  She had an attempt at biopsy of this area that obtained only benign breast tissue.  She presents now to discuss excision of this area.  She did have significant bruising and hematoma after the biopsy.     Review of Systems: A complete review of systems was obtained from the patient.  I have reviewed this information and discussed as appropriate with the patient.  See HPI as well for other ROS.   Review of Systems  Constitutional: Negative.   HENT: Negative.    Eyes: Negative.   Respiratory: Negative.    Cardiovascular: Negative.   Gastrointestinal: Negative.   Genitourinary: Negative.   Musculoskeletal: Negative.   Skin: Negative.   Neurological: Negative.   Endo/Heme/Allergies: Negative.   Psychiatric/Behavioral: Negative.          Medical History: Past Medical History      Past Medical History:  Diagnosis Date   Anxiety          Problem List     Patient Active Problem List  Diagnosis   Multiple sclerosis (CMS/HHS-HCC)   Chronic headaches   Subareolar mass of right breast   Bloody discharge from right nipple        Past Surgical History       Past Surgical History:  Procedure Laterality Date   APPENDECTOMY       ARTHROSCOPIC REPAIR ACL            Allergies        Allergies  Allergen Reactions   Prochlorperazine Muscle Pain and Other (See Comments)      Other reaction(s): Myalgias (Muscle Pain), Other  Muscle spasms   Causes muscle spasms.   Muscle spasms   Muscle spasm        Medications Ordered Prior to Encounter        Current Outpatient Medications on File Prior to Visit  Medication Sig Dispense Refill   cyanocobalamin (VITAMIN B12) 100 MCG tablet Take 100 mcg by mouth once daily       escitalopram oxalate (LEXAPRO) 10 MG tablet Take 1.5 tablets by mouth once daily       meloxicam (MOBIC) 15 MG tablet Take 1 tablet by mouth once daily       perphenazine 4 MG tablet TAKE 1-2 TABS AS NEEDED EVERY 2-3 HOURS FOR HEADACHE RESCUE MAX 4 TABS/24 HOURS       rizatriptan (MAXALT) 10 MG tablet TAKE 1 TAB AS NEEDED MIGRAINE MAY REPEAT ONCE AFTER 2 HOURS       zonisamide (ZONEGRAN) 100 MG capsule Take 2 capsules by mouth once daily        No current facility-administered medications on file prior to visit.        Family History  History reviewed. No pertinent family history.      Tobacco Use History  Social History       Tobacco Use  Smoking Status Never  Smokeless Tobacco Never        Social History  Social History         Socioeconomic History   Marital status: Married  Tobacco Use   Smoking status: Never   Smokeless tobacco: Never  Substance and Sexual Activity   Alcohol use: Yes      Alcohol/week: 2.0 - 3.0 standard drinks of alcohol      Types: 2 - 3 Standard drinks or equivalent per week   Drug use: Never    Social Drivers of Acupuncturist Strain: Low Risk  (12/04/2022)    Received from [facility]    Overall Financial Resource Strain (CARDIA)     Difficulty of Paying Living Expenses: Not hard at all  Food Insecurity: No Food Insecurity (12/04/2022)    Received from [facility]    Hunger Vital Sign     Worried About Running Out of Food in the Last Year: Never true     Ran Out of Food in  the Last Year: Never true  Transportation Needs: No Transportation Needs (12/04/2022)    Received from [facility] - Transportation     Lack of Transportation (Medical): No     Lack of Transportation (Non-Medical): No  Physical Activity: Insufficiently Active (12/04/2022)    Received from [facility]    Exercise Vital Sign     Days of Exercise per Week: 3 days     Minutes of Exercise per Session: 30 min  Stress: No Stress Concern Present (12/04/2022)    Received from [facility] - Occupational Stress Questionnaire     Feeling of Stress : Not at all  Social Connections: Moderately Integrated (12/04/2022)    Received from [facility]    Social Network     How would you rate your social network (family, work, friends)?: Adequate participation with social networks  Housing Stability: Low Risk  (12/04/2022)    Received from [facility] Stability Vital Sign     Unable to Pay for Housing in the Last Year: No     Number of Places Lived in the Last Year: 1     In the last 12 months, was there a time when you did not have a steady place to sleep or slept in a shelter (including now)?: No        Objective:         Vitals:    05/27/23 1335  BP: 125/80  Pulse: 88  Temp: 36.7 C (98 F)  SpO2: 98%  Weight: 69.9 kg (154 lb)  Height: 154.9 cm (5\' 1" )    Body mass index is 29.1 kg/m.   Physical Exam    Constitutional:  WDWN in NAD, conversant, no obvious deformities; lying in bed comfortably Eyes:  Pupils equal, round; sclera anicteric; moist conjunctiva; no lid lag HENT:  Oral mucosa moist; good dentition  Neck:  No masses palpated, trachea midline; no thyromegaly Lungs:  CTA bilaterally; normal respiratory effort Breasts:  symmetric, no nipple changes; no nipple discharge.  Ecchymosis and palpable hematoma below the right nipple.  No other palpable masses or lymphadenopathy on either side CV:  Regular rate and  rhythm; no murmurs; extremities  well-perfused with no edema Abd:  +bowel sounds, soft, non-tender, no palpable organomegaly; no palpable hernias Musc: Normal gait; no apparent clubbing or cyanosis in extremities Lymphatic:  No palpable cervical or axillary lymphadenopathy Skin:  Warm, dry; no sign of jaundice Psychiatric - alert and oriented x 4; calm mood and affect     Labs, Imaging and Diagnostic Testing: CLINICAL DATA:  42 year old female with spontaneous unilateral bloody right nipple discharge.   EXAM: DIGITAL DIAGNOSTIC UNILATERAL RIGHT MAMMOGRAM WITH TOMOSYNTHESIS AND CAD; ULTRASOUND RIGHT BREAST LIMITED   TECHNIQUE: Right digital diagnostic mammography and breast tomosynthesis was performed. The images were evaluated with computer-aided detection. ; Targeted ultrasound examination of the right breast was performed   COMPARISON:  Previous exam(s).   ACR Breast Density Category c: The breasts are heterogeneously dense, which may obscure small masses.   FINDINGS: No suspicious masses or calcifications are seen in the right breast. Spot compression tomograms were performed of the central right breast with no definite abnormality seen.   Targeted ultrasound of the right breast was performed. There is a mass/intraductal mass at the base of the right nipple at 12 o'clock retroareolar measuring 0.4 x 0.6 x 0.3 cm. Normal lymph nodes are present in the right axilla.   IMPRESSION: 0.6 cm intraductal mass/mass at the base of the right nipple at 12 o'clock retroareolar in a patient with new onset spontaneous bloody right nipple discharge.   RECOMMENDATION: Recommend ultrasound-guided core biopsy of the 0.6 cm mass in the right breast at 12 o'clock retroareolar.   I have discussed the findings and recommendations with the patient. If applicable, a reminder letter will be sent to the patient regarding the next appointment.   BI-RADS CATEGORY  4: Suspicious.      Electronically Signed   By: Edwin Cap M.D.   On: 05/08/2023 10:18     Assessment and Plan:  Diagnoses and all orders for this visit:   Subareolar mass of right breast   Bloody discharge from right nipple     Recommend right breast radioactive seed localized lumpectomy to excise this intraductal mass below the nipple.   The surgical procedure has been discussed with the patient.  Potential risks, benefits, alternative treatments, and expected outcomes have been explained.  All of the patient's questions at this time have been answered.  The likelihood of reaching the patient's treatment goal is good.  The patient understands the proposed surgical procedure and wishes to proceed.       Alejandria Wessells Delbert Harness, MD  05/27/2023 4:20 PM

## 2023-05-29 ENCOUNTER — Other Ambulatory Visit: Payer: Self-pay | Admitting: Surgery

## 2023-05-29 DIAGNOSIS — N6452 Nipple discharge: Secondary | ICD-10-CM

## 2023-05-29 DIAGNOSIS — N6341 Unspecified lump in right breast, subareolar: Secondary | ICD-10-CM

## 2023-06-08 IMAGING — DX DG ELBOW COMPLETE 3+V*L*
4 series · 4 of 4 positions shown · non-contrast
Comparison: None.

CLINICAL DATA: Left elbow pain after fall 10 days ago.

EXAM:
LEFT ELBOW - COMPLETE 3+ VIEW

[elbow ap]
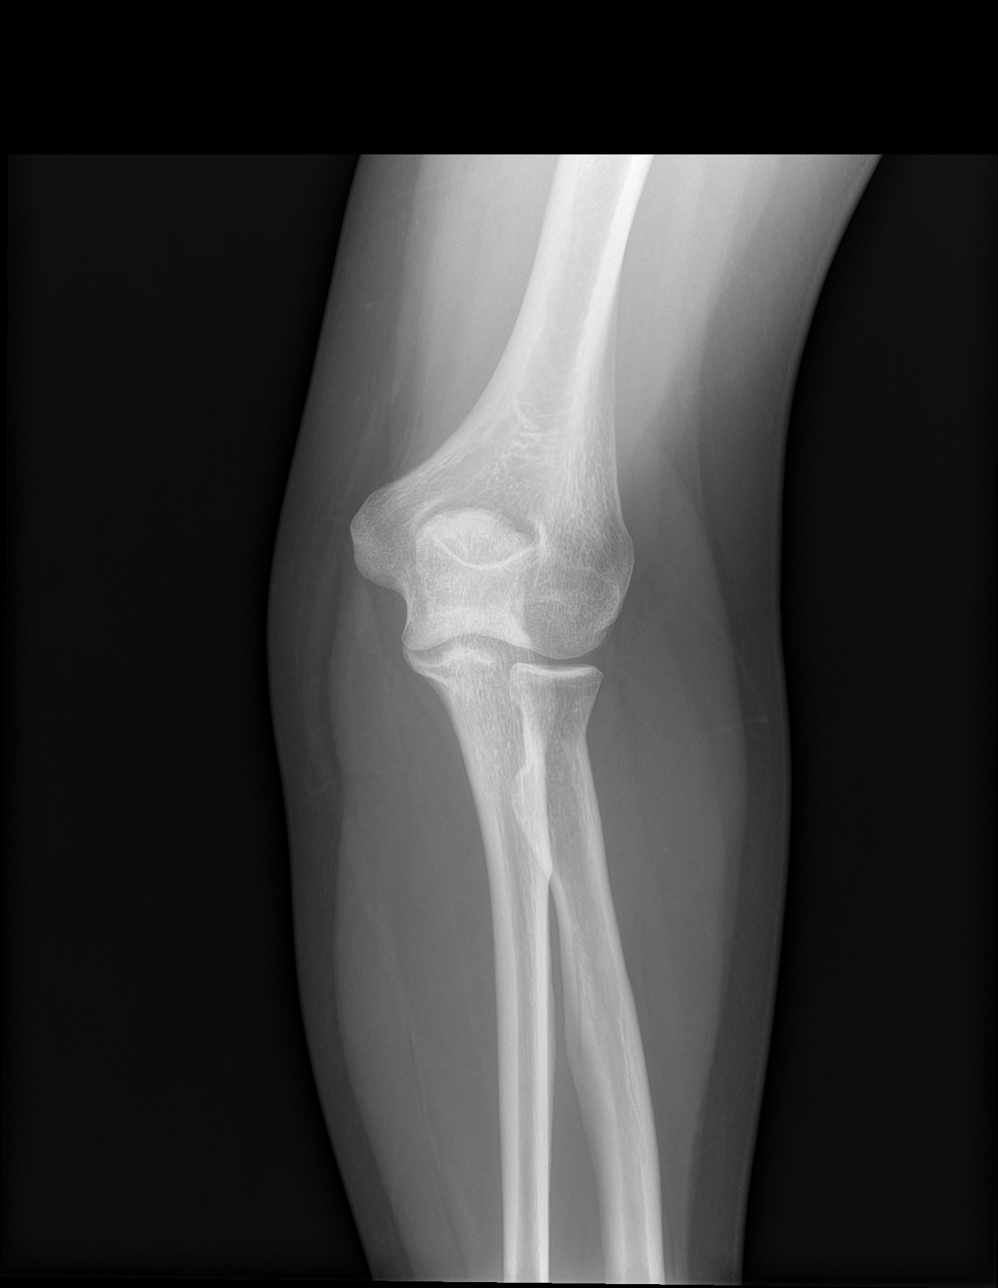

[elbow obl (1 of 2)]
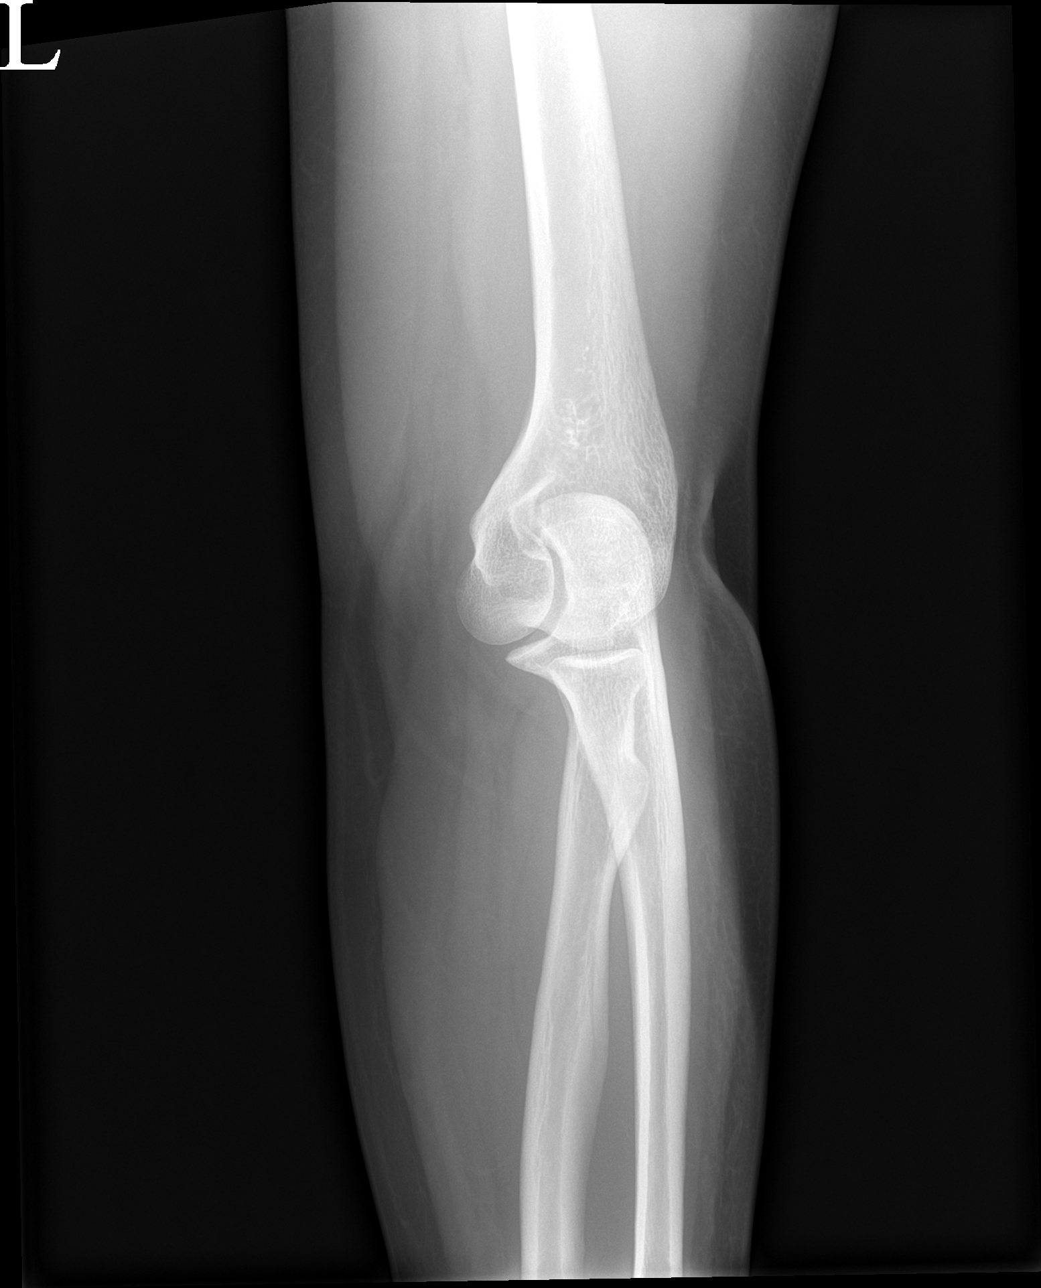

[elbow obl (2 of 2)]
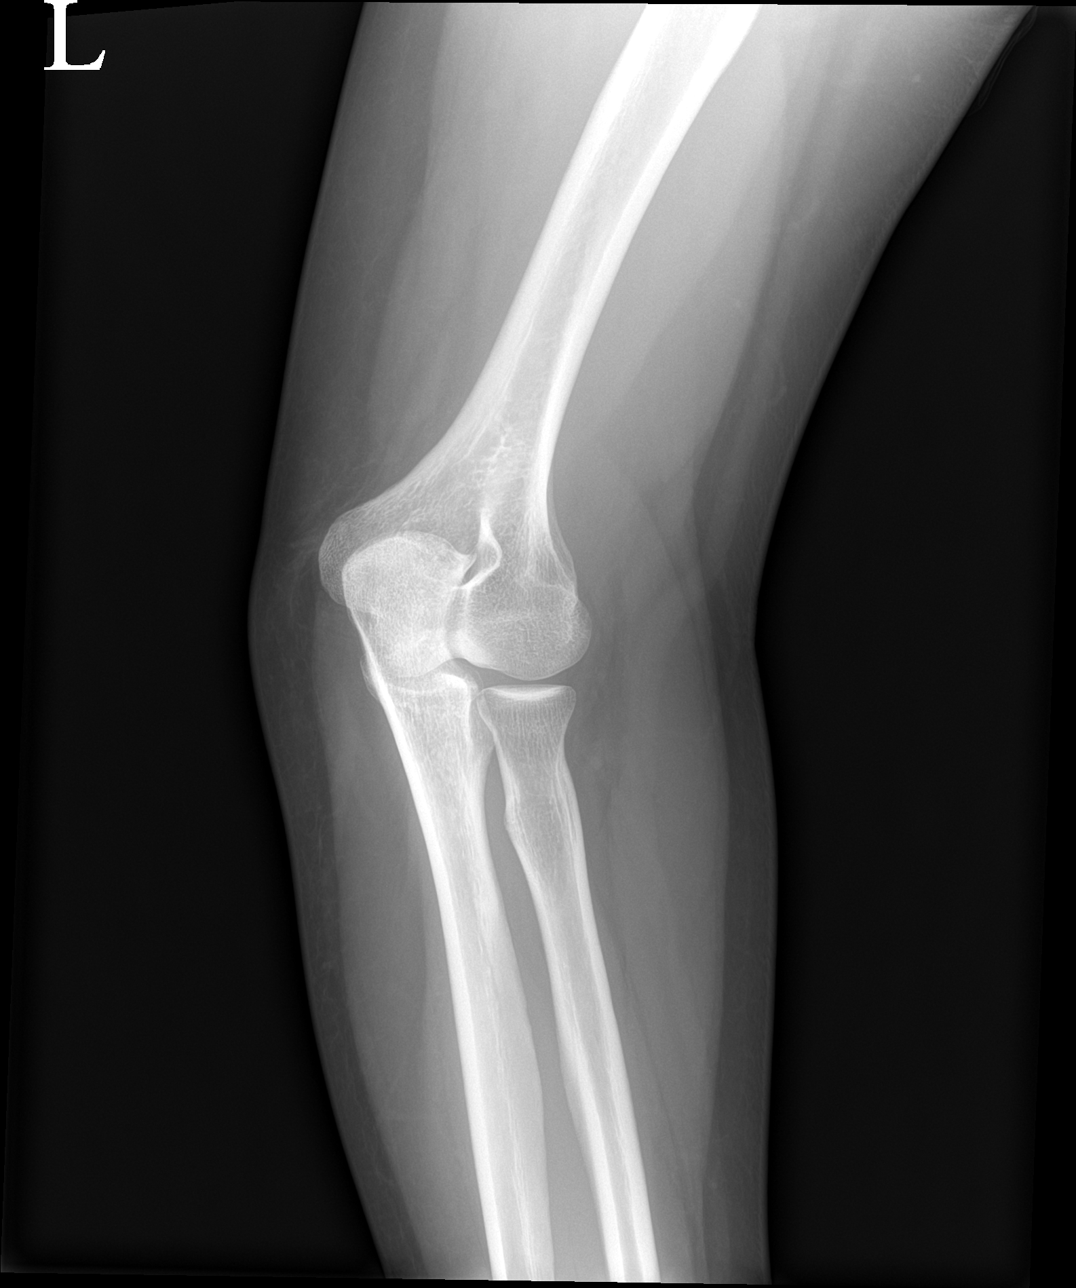

[elbow lat]
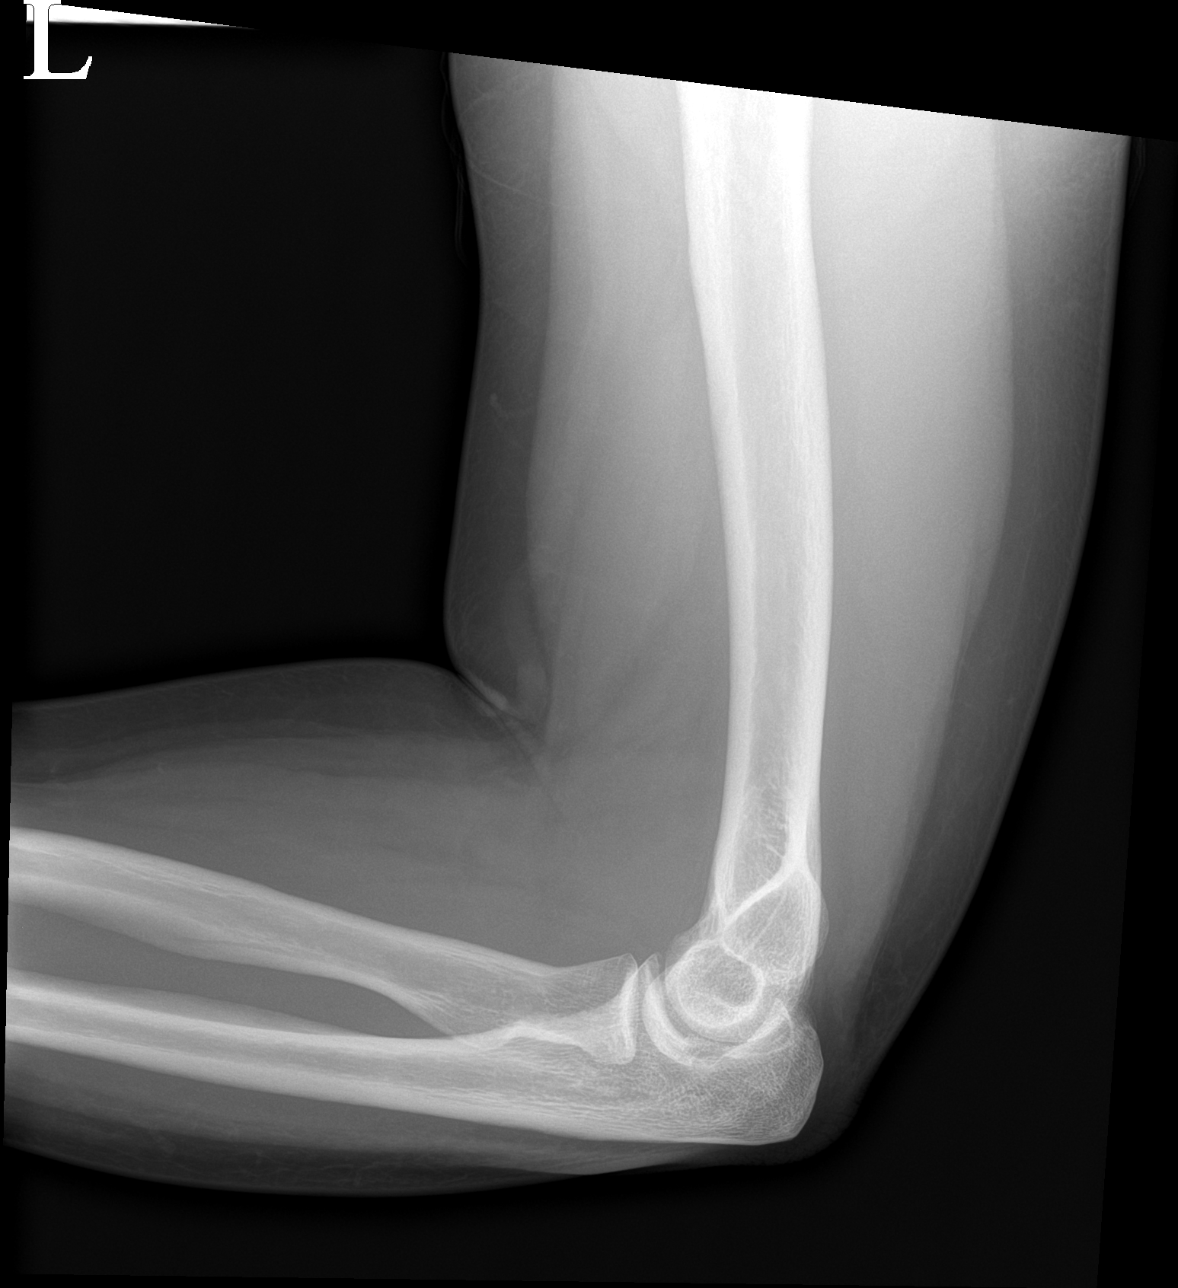

[4 of 4 positions shown; findings below may reference images not displayed]

FINDINGS: No fracture or elbow joint effusion. Joint spaces are preserved.
Regional soft tissues appear normal.
IMPRESSION: No fracture or elbow joint effusion.

## 2023-06-18 ENCOUNTER — Encounter (HOSPITAL_BASED_OUTPATIENT_CLINIC_OR_DEPARTMENT_OTHER): Payer: Self-pay | Admitting: Surgery

## 2023-06-25 ENCOUNTER — Ambulatory Visit
Admission: RE | Admit: 2023-06-25 | Discharge: 2023-06-25 | Disposition: A | Discharge status: Home / Self Care | Payer: BC Managed Care – PPO | Source: Ambulatory Visit | Attending: Surgery | Admitting: Surgery

## 2023-06-25 ENCOUNTER — Other Ambulatory Visit: Payer: Self-pay | Admitting: Surgery

## 2023-06-25 DIAGNOSIS — N6452 Nipple discharge: Secondary | ICD-10-CM

## 2023-06-25 DIAGNOSIS — N6341 Unspecified lump in right breast, subareolar: Secondary | ICD-10-CM

## 2023-06-25 HISTORY — PX: BREAST BIOPSY: SHX20

## 2023-06-25 NOTE — Progress Notes (Signed)
Sent patient text message to notify that she has a pre-surgical soap and drink to pick up today between 7am-3pm at [facility]. Callback number included for any questions.

## 2023-06-25 NOTE — Progress Notes (Signed)
      Enhanced Recovery after Surgery  Enhanced Recovery after Surgery is a protocol used to improve the stress on your body and your recovery after surgery.  Patient Instructions  The night before surgery:  No food after midnight. ONLY clear liquids after midnight  The day of surgery (if you do NOT have diabetes):  Drink ONE (1) Pre-Surgery Clear Ensure as directed.   This drink was given to you during your hospital  pre-op appointment visit. The pre-op nurse will instruct you on the time to drink the  Pre-Surgery Ensure depending on your surgery time. Finish the drink at the designated time by the pre-op nurse.  Nothing else to drink after completing the  Pre-Surgery Clear Ensure.  The day of surgery (if you have diabetes): Drink ONE (1) Gatorade 2 (G2) as directed. This drink was given to you during your hospital  pre-op appointment visit.  The pre-op nurse will instruct you on the time to drink the   Gatorade 2 (G2) depending on your surgery time. Color of the Gatorade may vary. Red is not allowed. Nothing else to drink after completing the  Gatorade 2 (G2).         If you have questions, please contact your surgeon's office.  Pre-surgical soap and instructions given as well.

## 2023-06-26 ENCOUNTER — Encounter (HOSPITAL_BASED_OUTPATIENT_CLINIC_OR_DEPARTMENT_OTHER)
Admission: RE | Disposition: A | Discharge status: Home / Self Care | Payer: Self-pay | Source: Home / Self Care | Attending: Surgery

## 2023-06-26 ENCOUNTER — Other Ambulatory Visit: Payer: Self-pay

## 2023-06-26 ENCOUNTER — Ambulatory Visit (HOSPITAL_BASED_OUTPATIENT_CLINIC_OR_DEPARTMENT_OTHER)
Admission: RE | Admit: 2023-06-26 | Discharge: 2023-06-26 | Disposition: A | Discharge status: Home / Self Care | Payer: BC Managed Care – PPO | Attending: Surgery | Admitting: Surgery

## 2023-06-26 ENCOUNTER — Ambulatory Visit (HOSPITAL_BASED_OUTPATIENT_CLINIC_OR_DEPARTMENT_OTHER): Payer: BC Managed Care – PPO | Admitting: Anesthesiology

## 2023-06-26 ENCOUNTER — Encounter (HOSPITAL_BASED_OUTPATIENT_CLINIC_OR_DEPARTMENT_OTHER): Payer: Self-pay | Admitting: Surgery

## 2023-06-26 ENCOUNTER — Ambulatory Visit
Admission: RE | Admit: 2023-06-26 | Discharge: 2023-06-26 | Disposition: A | Discharge status: Home / Self Care | Payer: BC Managed Care – PPO | Source: Ambulatory Visit | Attending: Surgery | Admitting: Surgery

## 2023-06-26 DIAGNOSIS — N6021 Fibroadenosis of right breast: Secondary | ICD-10-CM | POA: Diagnosis present

## 2023-06-26 DIAGNOSIS — G43909 Migraine, unspecified, not intractable, without status migrainosus: Secondary | ICD-10-CM | POA: Diagnosis not present

## 2023-06-26 DIAGNOSIS — G35 Multiple sclerosis: Secondary | ICD-10-CM | POA: Diagnosis not present

## 2023-06-26 DIAGNOSIS — N6081 Other benign mammary dysplasias of right breast: Secondary | ICD-10-CM | POA: Diagnosis not present

## 2023-06-26 DIAGNOSIS — Z419 Encounter for procedure for purposes other than remedying health state, unspecified: Secondary | ICD-10-CM

## 2023-06-26 DIAGNOSIS — N6452 Nipple discharge: Secondary | ICD-10-CM

## 2023-06-26 DIAGNOSIS — D241 Benign neoplasm of right breast: Secondary | ICD-10-CM | POA: Insufficient documentation

## 2023-06-26 DIAGNOSIS — N6341 Unspecified lump in right breast, subareolar: Secondary | ICD-10-CM

## 2023-06-26 DIAGNOSIS — Z79620 Long term (current) use of immunosuppressive biologic: Secondary | ICD-10-CM | POA: Diagnosis not present

## 2023-06-26 HISTORY — DX: Multiple sclerosis: G35

## 2023-06-26 HISTORY — PX: RADIOACTIVE SEED GUIDED EXCISIONAL BREAST BIOPSY: SHX6490

## 2023-06-26 HISTORY — DX: Multiple sclerosis, unspecified: G35.D

## 2023-06-26 HISTORY — DX: Migraine, unspecified, not intractable, without status migrainosus: G43.909

## 2023-06-26 LAB — POCT PREGNANCY, URINE: Preg Test, Ur: NEGATIVE

## 2023-06-26 SURGERY — RADIOACTIVE SEED GUIDED BREAST BIOPSY
Anesthesia: General | Site: Breast | Laterality: Right

## 2023-06-26 MED ORDER — FENTANYL CITRATE (PF) 100 MCG/2ML IJ SOLN
INTRAMUSCULAR | Status: AC
  Filled 2023-06-26: qty 2

## 2023-06-26 MED ORDER — ONDANSETRON HCL 4 MG/2ML IJ SOLN
INTRAMUSCULAR | Status: DC | PRN
  Administered 2023-06-26: 4 mg via INTRAVENOUS

## 2023-06-26 MED ORDER — LACTATED RINGERS IV SOLN: INTRAVENOUS | Status: DC

## 2023-06-26 MED ORDER — CEFAZOLIN SODIUM-DEXTROSE 2-4 GM/100ML-% IV SOLN
2.0000 g | INTRAVENOUS | Status: AC
  Administered 2023-06-26: 2 g via INTRAVENOUS

## 2023-06-26 MED ORDER — DEXAMETHASONE SODIUM PHOSPHATE 10 MG/ML IJ SOLN
INTRAMUSCULAR | Status: DC | PRN
  Administered 2023-06-26: 10 mg via INTRAVENOUS

## 2023-06-26 MED ORDER — OXYCODONE HCL 5 MG PO TABS
ORAL_TABLET | ORAL | Status: AC
  Filled 2023-06-26: qty 1

## 2023-06-26 MED ORDER — PROPOFOL 10 MG/ML IV BOLUS
INTRAVENOUS | Status: DC | PRN
  Administered 2023-06-26: 200 mg via INTRAVENOUS

## 2023-06-26 MED ORDER — ACETAMINOPHEN 500 MG PO TABS
ORAL_TABLET | ORAL | Status: AC
  Filled 2023-06-26: qty 2

## 2023-06-26 MED ORDER — CHLORHEXIDINE GLUCONATE CLOTH 2 % EX PADS: 6.0000 | MEDICATED_PAD | Freq: Once | CUTANEOUS | Status: DC

## 2023-06-26 MED ORDER — ONDANSETRON HCL 4 MG/2ML IJ SOLN
INTRAMUSCULAR | Status: AC
  Filled 2023-06-26: qty 2

## 2023-06-26 MED ORDER — SODIUM CHLORIDE 0.9 % IV SOLN: INTRAVENOUS | Status: DC | PRN

## 2023-06-26 MED ORDER — PROPOFOL 10 MG/ML IV BOLUS
INTRAVENOUS | Status: AC
  Filled 2023-06-26: qty 20

## 2023-06-26 MED ORDER — CEFAZOLIN SODIUM-DEXTROSE 2-4 GM/100ML-% IV SOLN
INTRAVENOUS | Status: AC
  Filled 2023-06-26: qty 100

## 2023-06-26 MED ORDER — MIDAZOLAM HCL 5 MG/5ML IJ SOLN
INTRAMUSCULAR | Status: DC | PRN
  Administered 2023-06-26: 2 mg via INTRAVENOUS

## 2023-06-26 MED ORDER — MIDAZOLAM HCL 2 MG/2ML IJ SOLN
INTRAMUSCULAR | Status: AC
  Filled 2023-06-26: qty 2

## 2023-06-26 MED ORDER — DEXAMETHASONE SODIUM PHOSPHATE 10 MG/ML IJ SOLN
INTRAMUSCULAR | Status: AC
  Filled 2023-06-26: qty 1

## 2023-06-26 MED ORDER — OXYCODONE HCL 5 MG PO TABS
5.0000 mg | ORAL_TABLET | Freq: Once | ORAL | Status: AC
  Administered 2023-06-26: 5 mg via ORAL

## 2023-06-26 MED ORDER — FENTANYL CITRATE (PF) 100 MCG/2ML IJ SOLN
25.0000 ug | INTRAMUSCULAR | Status: DC | PRN
  Administered 2023-06-26: 50 ug via INTRAVENOUS

## 2023-06-26 MED ORDER — BUPIVACAINE-EPINEPHRINE 0.25% -1:200000 IJ SOLN
INTRAMUSCULAR | Status: DC | PRN
  Administered 2023-06-26: 10 mL

## 2023-06-26 MED ORDER — FENTANYL CITRATE (PF) 100 MCG/2ML IJ SOLN
INTRAMUSCULAR | Status: DC | PRN
  Administered 2023-06-26: 50 ug via INTRAVENOUS

## 2023-06-26 MED ORDER — ACETAMINOPHEN 500 MG PO TABS
1000.0000 mg | ORAL_TABLET | ORAL | Status: AC
  Administered 2023-06-26: 1000 mg via ORAL

## 2023-06-26 MED ORDER — LIDOCAINE HCL (CARDIAC) PF 100 MG/5ML IV SOSY
PREFILLED_SYRINGE | INTRAVENOUS | Status: DC | PRN
  Administered 2023-06-26: 60 mg via INTRAVENOUS

## 2023-06-26 SURGICAL SUPPLY — 40 items
APPLIER CLIP 9.375 MED OPEN (MISCELLANEOUS)
BENZOIN TINCTURE PRP APPL 2/3 (GAUZE/BANDAGES/DRESSINGS) ×1 IMPLANT
BLADE HEX COATED 2.75 (ELECTRODE) ×1 IMPLANT
BLADE SURG 15 STRL LF DISP TIS (BLADE) ×1 IMPLANT
CANISTER SUCT 1200ML W/VALVE (MISCELLANEOUS) ×1 IMPLANT
CHLORAPREP W/TINT 26 (MISCELLANEOUS) ×1 IMPLANT
CLIP APPLIE 9.375 MED OPEN (MISCELLANEOUS) IMPLANT
COVER BACK TABLE 60X90IN (DRAPES) ×1 IMPLANT
COVER MAYO STAND STRL (DRAPES) ×1 IMPLANT
COVER PROBE CYLINDRICAL 5X96 (MISCELLANEOUS) ×1 IMPLANT
DRAPE LAPAROTOMY 100X72 PEDS (DRAPES) ×1 IMPLANT
DRAPE UTILITY XL STRL (DRAPES) ×1 IMPLANT
DRSG TEGADERM 4X4.75 (GAUZE/BANDAGES/DRESSINGS) ×1 IMPLANT
ELECT REM PT RETURN 9FT ADLT (ELECTROSURGICAL) ×1
ELECTRODE REM PT RTRN 9FT ADLT (ELECTROSURGICAL) ×1 IMPLANT
GAUZE SPONGE 2X2 STRL 8-PLY (GAUZE/BANDAGES/DRESSINGS) IMPLANT
GAUZE SPONGE 4X4 12PLY STRL LF (GAUZE/BANDAGES/DRESSINGS) ×1 IMPLANT
GLOVE BIO SURGEON STRL SZ 6.5 (GLOVE) IMPLANT
GLOVE BIO SURGEON STRL SZ7 (GLOVE) ×1 IMPLANT
GLOVE BIOGEL PI IND STRL 6.5 (GLOVE) IMPLANT
GLOVE BIOGEL PI IND STRL 7.5 (GLOVE) ×1 IMPLANT
GOWN STRL REUS W/ TWL LRG LVL3 (GOWN DISPOSABLE) ×2 IMPLANT
KIT MARKER MARGIN INK (KITS) ×1 IMPLANT
NDL HYPO 25X1 1.5 SAFETY (NEEDLE) ×1 IMPLANT
NEEDLE HYPO 25X1 1.5 SAFETY (NEEDLE) ×1
NS IRRIG 1000ML POUR BTL (IV SOLUTION) ×1 IMPLANT
PACK BASIN DAY SURGERY FS (CUSTOM PROCEDURE TRAY) ×1 IMPLANT
PENCIL SMOKE EVACUATOR (MISCELLANEOUS) ×1 IMPLANT
SLEEVE SCD COMPRESS KNEE MED (STOCKING) ×1 IMPLANT
SPIKE FLUID TRANSFER (MISCELLANEOUS) IMPLANT
SPONGE T-LAP 4X18 ~~LOC~~+RFID (SPONGE) ×1 IMPLANT
STRIP CLOSURE SKIN 1/2X4 (GAUZE/BANDAGES/DRESSINGS) ×1 IMPLANT
SUT MON AB 4-0 PC3 18 (SUTURE) ×1 IMPLANT
SUT SILK 2 0 SH (SUTURE) IMPLANT
SUT VIC AB 3-0 SH 27X BRD (SUTURE) ×1 IMPLANT
SYR CONTROL 10ML LL (SYRINGE) ×1 IMPLANT
TOWEL GREEN STERILE FF (TOWEL DISPOSABLE) ×1 IMPLANT
TRAY FAXITRON CT DISP (TRAY / TRAY PROCEDURE) ×1 IMPLANT
TUBE CONNECTING 20X1/4 (TUBING) ×1 IMPLANT
YANKAUER SUCT BULB TIP NO VENT (SUCTIONS) ×1 IMPLANT

## 2023-06-26 NOTE — Op Note (Signed)
Pre-op Diagnosis:  Right breast mass and nipple discharge Post-op Diagnosis: same Procedure:  Right radioactive seed localized excisional breast biopsy Surgeon:  Kamyra Schroeck K. Anesthesia:  GEN - LMA Indications:  This is a 42 year old female with a past medical history of multiple sclerosis that is well-managed as well as chronic migraine headaches who presents with 35-month history of intermittent bloody discharge from the right nipple. The patient reports no associated pain or palpable masses. She has no family history of breast cancer. She underwent mammogram and ultrasound which revealed a 6 mm mass at the base of the right nipple located 12:00 in the retroareolar region. She had an attempt at biopsy of this area that obtained only benign breast tissue. She presents now to discuss excision of this area.  A radioactive seed was placed in the mass yesterday by Radiology.   Description of procedure: The patient is brought to the operating room placed in supine position on the operating room table. After an adequate level of general anesthesia was obtained, her right breast was prepped with ChloraPrep and draped in sterile fashion. A timeout was taken to ensure the proper patient and proper procedure. We interrogated the breast with the neoprobe. We made a circumareolar incision around the lateral side of the nipple after infiltrating with 0.25% Marcaine. Dissection was carried down in the breast tissue with cautery. We dissected the breast tissue off of the posterior surface of the areola and nipple.  We used the neoprobe to guide [place] towards the radioactive seed. We excised an area of tissue around the radioactive seed 1.5 cm in diameter. The specimen was removed and was oriented with a paint kit. Specimen mammogram showed the radioactive seed within the specimen. The biopsy clip was not in this initial specimen.  We excised an additional inferior margin which was also marked with paint and inspected with  specimen mammogram.  The clip is not present in this specimen either.  We decided not to pursue the clip, since the initial biopsy showed only benign breast tissue and the seed is within the mass.  These specimens were sent for pathologic examination. There is no residual radioactivity within the biopsy cavity. We inspected carefully for hemostasis. The wound was thoroughly irrigated. The wound was closed with a deep layer of 3-0 Vicryl and a subcuticular layer of 4-0 Monocryl. Benzoin Steri-Strips were applied. The patient was then extubated and brought to the recovery room in stable condition. All sponge, instrument, and needle counts are correct.  Wilmon Arms. Corliss Skains, MD, [facility]  General/ Trauma Surgery  06/26/2023 9:21 AM

## 2023-06-26 NOTE — Anesthesia Preprocedure Evaluation (Addendum)
Anesthesia Evaluation  Patient identified by MRN, date of birth, ID band Patient awake    Reviewed: Allergy & Precautions, NPO status , Patient's Chart, lab work & pertinent test results  Airway Mallampati: I  TM Distance: >3 FB Neck ROM: Full    Dental no notable dental hx. (+) Teeth Intact, Dental Advisory Given   Pulmonary neg pulmonary ROS   Pulmonary exam normal breath sounds clear to auscultation       Cardiovascular negative cardio ROS Normal cardiovascular exam Rhythm:Regular Rate:Normal     Neuro/Psych  Headaches MS, symptoms include neck stiffness, on ocravus: last infusion July  negative psych ROS   GI/Hepatic negative GI ROS, Neg liver ROS,,,  Endo/Other  negative endocrine ROS    Renal/GU negative Renal ROS  negative genitourinary   Musculoskeletal negative musculoskeletal ROS (+)    Abdominal   Peds  Hematology negative hematology ROS (+)   Anesthesia Other Findings   Reproductive/Obstetrics                             Anesthesia Physical Anesthesia Plan  ASA: 2  Anesthesia Plan: General   Post-op Pain Management: Tylenol PO (pre-op)*   Induction: Intravenous  PONV Risk Score and Plan: 3 and Ondansetron, Dexamethasone and Midazolam  Airway Management Planned: LMA  Additional Equipment:   Intra-op Plan:   Post-operative Plan: Extubation in OR  Informed Consent: I have reviewed the patients History and Physical, chart, labs and discussed the procedure including the risks, benefits and alternatives for the proposed anesthesia with the patient or authorized representative who has indicated his/her understanding and acceptance.     Dental advisory given  Plan Discussed with: CRNA  Anesthesia Plan Comments:        Anesthesia Quick Evaluation

## 2023-06-26 NOTE — Interval H&P Note (Signed)
History and Physical Interval Note:  06/26/2023 7:22 AM  Casey Murray  has presented today for surgery, with the diagnosis of RIGHT BREAST MASS, RIGHT BLOODY NIPPLE DISCHARGE.  The various methods of treatment have been discussed with the patient and family. After consideration of risks, benefits and other options for treatment, the patient has consented to  Procedure(s) with comments: RIGHT BREAST RADIOACTIVE SEED LOCALIZED EXCISIONAL BIOPSY (Right) - LMA as a surgical intervention.  The patient's history has been reviewed, patient examined, no change in status, stable for surgery.  I have reviewed the patient's chart and labs.  Questions were answered to the patient's satisfaction.     [name]

## 2023-06-26 NOTE — Anesthesia Procedure Notes (Signed)
Procedure Name: LMA Insertion Date/Time: 06/26/2023 8:40 AM  Performed by: Thornell Mule, CRNAPre-anesthesia Checklist: Patient identified, Emergency Drugs available, Suction available and Patient being monitored Patient Re-evaluated:Patient Re-evaluated prior to induction Oxygen Delivery Method: Circle system utilized Preoxygenation: Pre-oxygenation with 100% oxygen Induction Type: IV induction LMA: LMA inserted LMA Size: 4.0 Number of attempts: 1 Placement Confirmation: positive ETCO2 Tube secured with: Tape Dental Injury: Teeth and Oropharynx as per pre-operative assessment

## 2023-06-26 NOTE — Anesthesia Postprocedure Evaluation (Signed)
Anesthesia Post Note  Patient: Casey Murray  Procedure(s) Performed: RIGHT BREAST RADIOACTIVE SEED LOCALIZED EXCISIONAL BIOPSY (Right: Breast)     Patient location during evaluation: PACU Anesthesia Type: General Level of consciousness: awake and alert Pain management: pain level controlled Vital Signs Assessment: post-procedure vital signs reviewed and stable Respiratory status: spontaneous breathing, nonlabored ventilation, respiratory function stable and patient connected to nasal cannula oxygen Cardiovascular status: blood pressure returned to baseline and stable Postop Assessment: no apparent nausea or vomiting Anesthetic complications: no  No notable events documented.  Last Vitals:  Vitals:   06/26/23 0955 06/26/23 1000  BP:  112/83  Pulse: (!) 55 (!) 58  Resp: 18 16  Temp:    SpO2: 97% 98%    Last Pain:  Vitals:   06/26/23 1007  TempSrc:   PainSc: 3                  Eleonore Shippee L Meesha Sek

## 2023-06-26 NOTE — Discharge Instructions (Addendum)
[facility] Office Phone Number [phone]  BREAST BIOPSY/ PARTIAL MASTECTOMY: POST OP INSTRUCTIONS  Always review your discharge instruction sheet given to you by the facility where your surgery was performed.  IF YOU HAVE DISABILITY OR FAMILY LEAVE FORMS, YOU MUST BRING THEM TO THE OFFICE FOR PROCESSING.  DO NOT GIVE THEM TO YOUR DOCTOR.  A prescription for pain medication may be given to you upon discharge.  Take your pain medication as prescribed, if needed.  If narcotic pain medicine is not needed, then you may take acetaminophen (Tylenol) or ibuprofen (Advil) as needed. Take your usually prescribed medications unless otherwise directed If you need a refill on your pain medication, please contact your pharmacy.  They will contact our office to request authorization.  Prescriptions will not be filled after 5pm or on week-ends. You should eat very light the first 24 hours after surgery, such as soup, crackers, pudding, etc.  Resume your normal diet the day after surgery. Most patients will experience some swelling and bruising in the breast.  Ice packs and a good support bra will help.  Swelling and bruising can take several days to resolve.  It is common to experience some constipation if taking pain medication after surgery.  Increasing fluid intake and taking a stool softener will usually help or prevent this problem from occurring.  A mild laxative (Milk of Magnesia or Miralax) should be taken according to package directions if there are no bowel movements after 48 hours. Unless discharge instructions indicate otherwise, you may remove your bandages 24-48 hours after surgery, and you may shower at that time.  You may have steri-strips (small skin tapes) in place directly over the incision.  These strips should be left on the skin for 7-10 days.  If your surgeon used skin glue on the incision, you may shower in 24 hours.  The glue will flake off over the next 2-3 weeks.  Any  sutures or staples will be removed at the office during your follow-up visit. ACTIVITIES:  You may resume regular daily activities (gradually increasing) beginning the next day.  Wearing a good support bra or sports bra minimizes pain and swelling.  You may have sexual intercourse when it is comfortable. You may drive when you no longer are taking prescription pain medication, you can comfortably wear a seatbelt, and you can safely maneuver your car and apply brakes. RETURN TO WORK:  ______________________________________________________________________________________ Casey Murray should see your doctor in the office for a follow-up appointment approximately two weeks after your surgery.  Your doctor's nurse will typically make your follow-up appointment when she calls you with your pathology report.  Expect your pathology report 2-3 business days after your surgery.  You may call to check if you do not hear from [place] after three days. OTHER INSTRUCTIONS: _______________________________________________________________________________________________ _____________________________________________________________________________________________________________________________________ _____________________________________________________________________________________________________________________________________ _____________________________________________________________________________________________________________________________________  WHEN TO CALL YOUR DOCTOR: Fever over 101.0 Nausea and/or vomiting. Extreme swelling or bruising. Continued bleeding from incision. Increased pain, redness, or drainage from the incision.  The clinic staff is available to answer your questions during regular business hours.  Please don't hesitate to call and ask to speak to one of the nurses for clinical concerns.  If you have a medical emergency, go to the nearest emergency room or call 911.  A surgeon from [facility] is always on call at the hospital.  For further questions, please visit [email or web address]   No Tylenol until 1:15 pm

## 2023-06-26 NOTE — Transfer of Care (Signed)
Immediate Anesthesia Transfer of Care Note  Patient: Casey Murray  Procedure(s) Performed: RIGHT BREAST RADIOACTIVE SEED LOCALIZED EXCISIONAL BIOPSY (Right: Breast)  Patient Location: PACU  Anesthesia Type:General  Level of Consciousness: awake, drowsy, and patient cooperative  Airway & Oxygen Therapy: Patient Spontanous Breathing and Patient connected to face mask oxygen  Post-op Assessment: Report given to RN and Post -op Vital signs reviewed and stable  Post vital signs: Reviewed and stable  Last Vitals:  Vitals Value Taken Time  BP    Temp    Pulse    Resp    SpO2      Last Pain:  Vitals:   06/26/23 0711  TempSrc: Oral  PainSc: 0-No pain         Complications: No notable events documented.

## 2023-06-27 LAB — SURGICAL PATHOLOGY

## 2023-06-30 ENCOUNTER — Encounter (HOSPITAL_BASED_OUTPATIENT_CLINIC_OR_DEPARTMENT_OTHER): Payer: Self-pay | Admitting: Surgery

## 2024-07-11 ENCOUNTER — Encounter (HOSPITAL_BASED_OUTPATIENT_CLINIC_OR_DEPARTMENT_OTHER): Payer: Self-pay

## 2024-07-11 ENCOUNTER — Emergency Department (HOSPITAL_BASED_OUTPATIENT_CLINIC_OR_DEPARTMENT_OTHER)

## 2024-07-11 ENCOUNTER — Other Ambulatory Visit: Payer: Self-pay

## 2024-07-11 ENCOUNTER — Emergency Department (HOSPITAL_BASED_OUTPATIENT_CLINIC_OR_DEPARTMENT_OTHER)
Admission: EM | Admit: 2024-07-11 | Discharge: 2024-07-11 | Disposition: A | Discharge status: Home / Self Care | Attending: Emergency Medicine | Admitting: Emergency Medicine

## 2024-07-11 DIAGNOSIS — N201 Calculus of ureter: Secondary | ICD-10-CM

## 2024-07-11 LAB — URINALYSIS, ROUTINE W REFLEX MICROSCOPIC
Bilirubin Urine: NEGATIVE
Glucose, UA: NEGATIVE mg/dL
Ketones, ur: NEGATIVE mg/dL
Leukocytes,Ua: NEGATIVE
Nitrite: NEGATIVE
Protein, ur: NEGATIVE mg/dL
RBC / HPF: >50 RBC/hpf (ref 0–5)
Specific Gravity, Urine: 1.022 (ref 1.005–1.030)
pH: 6 (ref 5.0–8.0)

## 2024-07-11 LAB — CBC
HCT: 37.4 % (ref 36.0–46.0)
Hemoglobin: 13.5 g/dL (ref 12.0–15.0)
MCH: 31 pg (ref 26.0–34.0)
MCHC: 36.1 g/dL — ABNORMAL HIGH (ref 30.0–36.0)
MCV: 85.8 fL (ref 80.0–100.0)
Platelets: 273 K/uL (ref 150–400)
RBC: 4.36 MIL/uL (ref 3.87–5.11)
RDW: 12.5 % (ref 11.5–15.5)
WBC: 7.3 K/uL (ref 4.0–10.5)
nRBC: 0 % (ref 0.0–0.2)

## 2024-07-11 LAB — BASIC METABOLIC PANEL WITH GFR
Anion gap: 12 (ref 5–15)
BUN: 18 mg/dL (ref 6–20)
CO2: 20 mmol/L — ABNORMAL LOW (ref 22–32)
Calcium: 9.4 mg/dL (ref 8.9–10.3)
Chloride: 106 mmol/L (ref 98–111)
Creatinine, Ser: 1 mg/dL (ref 0.44–1.00)
GFR, Estimated: >60 mL/min (ref >60)
Glucose, Bld: 107 mg/dL — ABNORMAL HIGH (ref 70–99)
Potassium: 4.1 mmol/L (ref 3.5–5.1)
Sodium: 139 mmol/L (ref 135–145)

## 2024-07-11 LAB — PREGNANCY, URINE: Preg Test, Ur: NEGATIVE

## 2024-07-11 MED ORDER — ONDANSETRON 4 MG PO TBDP: 4.0000 mg | ORAL_TABLET | Freq: Three times a day (TID) | ORAL | 0 refills | Status: AC | PRN

## 2024-07-11 MED ORDER — KETOROLAC TROMETHAMINE 15 MG/ML IJ SOLN
15.0000 mg | Freq: Once | INTRAMUSCULAR | Status: AC
  Administered 2024-07-11: 15 mg via INTRAVENOUS
  Filled 2024-07-11: qty 1

## 2024-07-11 MED ORDER — ONDANSETRON HCL 4 MG/2ML IJ SOLN
4.0000 mg | Freq: Once | INTRAMUSCULAR | Status: AC
  Administered 2024-07-11: 4 mg via INTRAVENOUS
  Filled 2024-07-11: qty 2

## 2024-07-11 MED ORDER — HYDROMORPHONE HCL 1 MG/ML IJ SOLN
1.0000 mg | Freq: Once | INTRAMUSCULAR | Status: AC
  Administered 2024-07-11: 1 mg via INTRAVENOUS
  Filled 2024-07-11: qty 1

## 2024-07-11 MED ORDER — OXYCODONE-ACETAMINOPHEN 5-325 MG PO TABS: 1.0000 | ORAL_TABLET | Freq: Four times a day (QID) | ORAL | 0 refills | Status: AC | PRN

## 2024-07-11 MED ORDER — TAMSULOSIN HCL 0.4 MG PO CAPS: 0.4000 mg | ORAL_CAPSULE | Freq: Every day | ORAL | 0 refills | Status: AC

## 2024-07-11 NOTE — ED Provider Notes (Signed)
 [place] EMERGENCY DEPARTMENT AT [facility] Provider Note   CSN: [id number] Arrival date & time: 07/11/24  1014     Patient presents with: Flank Pain   Casey Murray is a 43 y.o. female.   Patient with history of MS, migraines, s/p appendectomy, to ED with sudden onset pain this morning in the right flank. Through the day has progressed to the RLQ abdomen. She states prior to onset of pain, she went to the bathroom and found it hard to empty her bladder and move her bowels but denies pain at that time. No hematuria. No fever. She experienced nausea and vomiting after onset of pain. She reports remote history of kidney stones as a teenager.   The history is provided by the patient. No language interpreter was used.  Flank Pain       Prior to Admission medications   Medication Sig Start Date End Date Taking? Authorizing Provider  ondansetron  (ZOFRAN -ODT) 4 MG disintegrating tablet Take 1 tablet (4 mg total) by mouth every 8 (eight) hours as needed for nausea or vomiting. 07/11/24  Yes Barbarann Kelly, Margit, PA-C  oxyCODONE -acetaminophen  (PERCOCET/ROXICET) 5-325 MG tablet Take 1 tablet by mouth every 6 (six) hours as needed for severe pain (pain score 7-10). 07/11/24  Yes Odell Margit, PA-C  tamsulosin  (FLOMAX ) 0.4 MG CAPS capsule Take 1 capsule (0.4 mg total) by mouth daily after breakfast. 07/11/24  Yes Lyndzee Kliebert, PA-C  baclofen (LIORESAL) 10 MG tablet Take 10 mg by mouth 3 (three) times daily as needed for muscle spasms.  Patient not taking: Reported on 10/30/2021 11/24/19   [provider]  escitalopram (LEXAPRO) 10 MG tablet Take 10 mg by mouth daily.    [provider]  ocrelizumab (OCREVUS) 300 MG/10ML injection Inject 300 mg into the vein once.    [provider]  ZOLMitriptan (ZOMIG) 2.5 MG tablet Take 2.5 mg by mouth as needed for migraine.  03/20/20   [provider]  zonisamide (ZONEGRAN) 50 MG capsule Take 150 mg by mouth at  bedtime. 03/28/20   [provider]    Allergies: Prochlorperazine edisylate and Compazine    Review of Systems  Genitourinary:  Positive for flank pain.    Updated Vital Signs BP 138/87 (BP Location: Right Arm)   Pulse 77   Temp 99 F (37.2 C) (Oral)   Resp 18   Ht 5' 1 (1.549 m)   Wt 70.3 kg   SpO2 100%   BMI 29.29 kg/m   Physical Exam Vitals and nursing note reviewed.  Constitutional:      General: She is not in acute distress.    Appearance: She is well-developed. She is not ill-appearing.  Pulmonary:     Effort: Pulmonary effort is normal.  Abdominal:     General: There is no distension.     Palpations: Abdomen is soft.     Tenderness: There is abdominal tenderness (RLQ). There is right CVA tenderness. There is no guarding or rebound.  Musculoskeletal:        General: Normal range of motion.     Cervical back: Normal range of motion.  Skin:    General: Skin is warm and dry.  Neurological:     Mental Status: She is alert and oriented to person, place, and time.     (all labs ordered are listed, but only abnormal results are displayed) Labs Reviewed  URINALYSIS, ROUTINE W REFLEX MICROSCOPIC - Abnormal; Notable for the following components:  Result Value   Hgb urine dipstick LARGE (*)    Bacteria, UA RARE (*)    All other components within normal limits  CBC - Abnormal; Notable for the following components:   MCHC 36.1 (*)    All other components within normal limits  BASIC METABOLIC PANEL WITH GFR - Abnormal; Notable for the following components:   CO2 20 (*)    Glucose, Bld 107 (*)    All other components within normal limits  PREGNANCY, URINE    EKG: None  Radiology: CT Renal Stone Study Result Date: 07/11/2024 EXAM: CT UROGRAM 07/11/2024 12:19:15 PM TECHNIQUE: CT of the abdomen and pelvis was performed without the administration of intravenous contrast as per CT urogram protocol. Multiplanar reformatted images as well as MIP urogram  images are provided for review. Automated exposure control, iterative reconstruction, and/or weight based adjustment of the mA/kV was utilized to reduce the radiation dose to as low as reasonably achievable. COMPARISON: CTA of the abdomen and pelvis dated 04/05/2020. CLINICAL HISTORY: Abdominal/flank pain, stone suspected. FINDINGS: LOWER CHEST: No acute abnormality. LIVER: The liver is unremarkable. GALLBLADDER AND BILE DUCTS: Gallbladder is unremarkable. No biliary ductal dilatation. SPLEEN: No acute abnormality. PANCREAS: No acute abnormality. ADRENAL GLANDS: No acute abnormality. KIDNEYS, URETERS AND BLADDER: There is a calculus measuring approximately 3.5 mm in diameter present within the right distal ureter at the ureterovesical junction. There is mild right hydroureteronephrosis. No stones in the left kidney or ureter. No left hydronephrosis. There is no significant perinephric or periureteral fatty stranding. Urinary bladder is unremarkable. GI AND BOWEL: Stomach demonstrates no acute abnormality. There is no bowel obstruction. PERITONEUM AND RETROPERITONEUM: No ascites. No free air. VASCULATURE: Aorta is normal in caliber. LYMPH NODES: No lymphadenopathy. REPRODUCTIVE ORGANS: There is an intrauterine device present. BONES AND SOFT TISSUES: No acute osseous abnormality. No focal soft tissue abnormality. IMPRESSION: 1. 3.5 mm calculus in the right distal ureter at the ureterovesical junction with mild right hydroureteronephrosis. No significant perinephric or periureteral stranding. Electronically signed by: Evalene Coho MD 07/11/2024 12:42 PM EST RP Workstation: [id number]     Procedures   Medications Ordered in the ED  HYDROmorphone  (DILAUDID ) injection 1 mg (1 mg Intravenous Given 07/11/24 1225)  ondansetron  (ZOFRAN ) injection 4 mg (4 mg Intravenous Given 07/11/24 1225)  ketorolac  (TORADOL ) 15 MG/ML injection 15 mg (15 mg Intravenous Given 07/11/24 1316)    Clinical Course as of 07/11/24 1318   Sun Jul 11, 2024  1259 Patient to ED with sudden onset right flank --> right lower abdomen this morning, N, V, difficulty urinating. Labs reassuring, UA with large hgb.  Symptoms c/w ureterolithiasis and CT renal was ordered. Dilaudid  and Zofran  for symptoms.   CT per radiology:  IMPRESSION: 1. 3.5 mm calculus in the right distal ureter at the ureterovesical junction with mild right hydroureteronephrosis. No significant perinephric or periureteral stranding.  Symptoms well controlled with IV medications. Expect the stone to pass without complication at 3.5 mm in distal ureter. She can be discharged home with medication, urology referral. Return precautions discussed.  [SU]    Clinical Course User Index [SU] Odell Balls, PA-C                                 Medical Decision Making Amount and/or Complexity of Data Reviewed Labs: ordered. Radiology: ordered.  Risk Prescription drug management.        Final diagnoses:  Ureterolithiasis  ED Discharge Orders          Ordered    oxyCODONE -acetaminophen  (PERCOCET/ROXICET) 5-325 MG tablet  Every 6 hours PRN        07/11/24 1316    ondansetron  (ZOFRAN -ODT) 4 MG disintegrating tablet  Every 8 hours PRN        07/11/24 1316    tamsulosin  (FLOMAX ) 0.4 MG CAPS capsule  Daily after breakfast        07/11/24 1316               Odell Balls, PA-C 07/11/24 1318    Bernard Drivers, MD 07/12/24 [phone]

## 2024-07-11 NOTE — ED Triage Notes (Signed)
 Pt reports R sided flank pain. Pt endorses N/V. Pt reports difficulty urinating and has urge to urinate.

## 2024-07-11 NOTE — Discharge Instructions (Signed)
 As we discussed, with the size of your stone it is expected that you will pass it soon without complication. Follow up with Dr. Nieves if pain is not improved in 2-3 days. Return to the ED if you develop any fever, have uncontrolled pain or vomiting, or for new concern.
